# Patient Record
Sex: Male | Born: 1970 | Race: Black or African American | Hispanic: No | Marital: Married | Smoking: Never smoker
Health system: Southern US, Community
[De-identification: ages and names within clinical notes are randomized; demographics above are authoritative.]

## PROBLEM LIST (undated history)

## (undated) DIAGNOSIS — I10 Essential (primary) hypertension: Secondary | ICD-10-CM

## (undated) HISTORY — PX: SHOULDER ARTHROSCOPY: SHX128

## (undated) HISTORY — PX: KNEE SURGERY: SHX244

---

## 2019-10-13 ENCOUNTER — Emergency Department (HOSPITAL_COMMUNITY)
Admission: EM | Admit: 2019-10-13 | Discharge: 2019-10-13 | Disposition: A | Discharge status: Home / Self Care | Payer: Self-pay | Attending: Emergency Medicine | Admitting: Emergency Medicine

## 2019-10-13 ENCOUNTER — Encounter (HOSPITAL_COMMUNITY): Payer: Self-pay | Admitting: Emergency Medicine

## 2019-10-13 ENCOUNTER — Other Ambulatory Visit: Payer: Self-pay

## 2019-10-13 ENCOUNTER — Emergency Department (HOSPITAL_COMMUNITY): Payer: Self-pay

## 2019-10-13 DIAGNOSIS — T502X5A Adverse effect of carbonic-anhydrase inhibitors, benzothiadiazides and other diuretics, initial encounter: Secondary | ICD-10-CM | POA: Insufficient documentation

## 2019-10-13 DIAGNOSIS — I10 Essential (primary) hypertension: Secondary | ICD-10-CM | POA: Insufficient documentation

## 2019-10-13 DIAGNOSIS — R0789 Other chest pain: Secondary | ICD-10-CM | POA: Insufficient documentation

## 2019-10-13 DIAGNOSIS — R079 Chest pain, unspecified: Secondary | ICD-10-CM

## 2019-10-13 DIAGNOSIS — E876 Hypokalemia: Secondary | ICD-10-CM | POA: Insufficient documentation

## 2019-10-13 HISTORY — DX: Essential (primary) hypertension: I10

## 2019-10-13 LAB — CBC
HCT: 42.2 % (ref 39.0–52.0)
Hemoglobin: 14 g/dL (ref 13.0–17.0)
MCH: 27.1 pg (ref 26.0–34.0)
MCHC: 33.2 g/dL (ref 30.0–36.0)
MCV: 81.6 fL (ref 80.0–100.0)
Platelets: 262 10*3/uL (ref 150–400)
RBC: 5.17 MIL/uL (ref 4.22–5.81)
RDW: 12.4 % (ref 11.5–15.5)
WBC: 7.3 10*3/uL (ref 4.0–10.5)
nRBC: 0 % (ref 0.0–0.2)

## 2019-10-13 LAB — BASIC METABOLIC PANEL
Anion gap: 10 (ref 5–15)
BUN: 15 mg/dL (ref 6–20)
CO2: 26 mmol/L (ref 22–32)
Calcium: 9.4 mg/dL (ref 8.9–10.3)
Chloride: 106 mmol/L (ref 98–111)
Creatinine, Ser: 0.97 mg/dL (ref 0.61–1.24)
GFR calc Af Amer: >60 mL/min (ref >60)
GFR calc non Af Amer: >60 mL/min (ref >60)
Glucose, Bld: 102 mg/dL — ABNORMAL HIGH (ref 70–99)
Potassium: 3.2 mmol/L — ABNORMAL LOW (ref 3.5–5.1)
Sodium: 142 mmol/L (ref 135–145)

## 2019-10-13 LAB — TROPONIN I (HIGH SENSITIVITY)
Troponin I (High Sensitivity): 5 ng/L (ref <18)
Troponin I (High Sensitivity): 6 ng/L (ref <18)

## 2019-10-13 MED ORDER — SODIUM CHLORIDE 0.9% FLUSH: 3.0000 mL | Freq: Once | INTRAVENOUS | Status: DC

## 2019-10-13 MED ORDER — POTASSIUM CHLORIDE CRYS ER 20 MEQ PO TBCR: 20.0000 meq | EXTENDED_RELEASE_TABLET | Freq: Every day | ORAL | 0 refills | Status: DC

## 2019-10-13 MED ORDER — POTASSIUM CHLORIDE CRYS ER 20 MEQ PO TBCR
40.0000 meq | EXTENDED_RELEASE_TABLET | Freq: Once | ORAL | Status: AC
  Administered 2019-10-13: 40 meq via ORAL
  Filled 2019-10-13: qty 2

## 2019-10-13 NOTE — Discharge Instructions (Signed)
Continue with your current medications.  Follow up with the Cardiologist.  Return if you are having any problems.

## 2019-10-13 NOTE — ED Triage Notes (Signed)
Patient reports central chest pain onset this afternoon , denies SOB , no emesis or diaphoresis .

## 2019-10-13 NOTE — ED Provider Notes (Signed)
[facility] EMERGENCY DEPARTMENT Provider Note   CSN: [id number] Arrival date & time: 10/13/19  0102    History   Chief Complaint Chief Complaint  Patient presents with   Chest Pain    HPI Danny Peck is a 48 y.o. male.   The history is provided by the patient.  He has history of hypertension and hyperlipidemia and comes in with 3 episodes of chest pain over the last 2 weeks.  Pain is sharp and radiates to the left side of his neck and his left arm.  Nothing seems to make it better, nothing seems to make it worse.  It does seem to be coming on at times of relatively increased stress.  1 episode occurred 2 weeks ago, second episode 3 days ago and the third episode tonight.  Tonight, he also had an episode that it felt like his heart was fluttering.  There is no associated dyspnea, nausea, diaphoresis, but he does relate one episode of night sweats in the last 2 weeks.  He has been evaluated for chest pains in the past and had a cardiac catheterization 2 years ago showing no blockages greater than 10%.  He is a non-smoker and denies family history of premature coronary atherosclerosis.  He does have significant home stressors.  Past Medical History:  Diagnosis Date   Hypertension     There are no active problems to display for this patient.   ** The histories are not reviewed yet. Please review them in the "History" navigator section and refresh this SmartLink.      Home Medications    Prior to Admission medications   Not on File    Family History No family history on file.  Social History Social History   Tobacco Use   Smoking status: Never Smoker   Smokeless tobacco: Never Used  Substance Use Topics   Alcohol use: Never    Frequency: Never   Drug use: Never     Allergies   Patient has no known allergies.   Review of Systems Review of Systems  All other systems reviewed and are negative.    Physical Exam Updated Vital  Signs BP (!) 132/91 (BP Location: Right Arm)    Pulse 76    Temp 98 F (36.7 C) (Oral)    Resp 18    SpO2 96%   Physical Exam Vitals signs and nursing note reviewed.    48 year old male, resting comfortably and in no acute distress. Vital signs are significant for mildly elevated blood pressure. Oxygen saturation is 96%, which is normal. Head is normocephalic and atraumatic. PERRLA, EOMI. Oropharynx is clear. Neck is nontender and supple without adenopathy or JVD. Back is nontender and there is no CVA tenderness. Lungs are clear without rales, wheezes, or rhonchi. Chest is nontender. Heart has regular rate and rhythm without murmur. Abdomen is soft, flat, nontender without masses or hepatosplenomegaly and peristalsis is normoactive. Extremities have no cyanosis or edema, full range of motion is present. Skin is warm and dry without rash. Neurologic: Mental status is normal, cranial nerves are intact, there are no motor or sensory deficits.  ED Treatments / Results  Labs (all labs ordered are listed, but only abnormal results are displayed) Labs Reviewed  BASIC METABOLIC PANEL - Abnormal; Notable for the following components:      Result Value   Potassium 3.2 (*)    Glucose, Bld 102 (*)    All other components within normal limits  CBC  TROPONIN I (HIGH SENSITIVITY)  TROPONIN I (HIGH SENSITIVITY)    EKG EKG Interpretation  Date/Time:  Sunday October 13 2019 01:16:25 EDT Ventricular Rate:  76 PR Interval:  212 QRS Duration: 98 QT Interval:  394 QTC Calculation: 443 R Axis:   -62 Text Interpretation: Sinus rhythm with 1st degree A-V block Left axis deviation Abnormal ECG No old tracing to compare Confirmed by Kelsee Preslar ([id number]) on 10/13/2019 1:40:35 AM   Radiology Dg Chest 2 View  Result Date: 10/13/2019 CLINICAL DATA:  47 year old male with chest pain. EXAM: CHEST - 2 VIEW COMPARISON:  None. FINDINGS: The heart size and mediastinal contours are within normal limits.  Both lungs are clear. The visualized skeletal structures are unremarkable. IMPRESSION: No active cardiopulmonary disease. Electronically Signed   By: Arash  Radparvar M.D.   On: 10/13/2019 02:25    Procedures Procedures   Medications Ordered in ED Medications  sodium chloride flush (NS) 0.9 % injection 3 mL (has no administration in time range)  potassium chloride SA (KLOR-CON) CR tablet 40 mEq (40 mEq Oral Given 10/13/19 0635)     Initial Impression / Assessment and Plan / ED Course  I have reviewed the triage vital signs and the nursing notes.  Pertinent labs & imaging results that were available during my care of the patient were reviewed by me and considered in my medical decision making (see chart for details).  Chest pain which seems very atypical for ACS.  ECG is unremarkable and chest x-ray is normal.  Troponin is normal x2.  Labs are significant only for hypokalemia which is presumably secondary to diuretic use.  Chest x-ray is unremarkable.  Old records are reviewed, and he did have an evaluation at [facility] for chest pain with a very similar description of symptoms.  At this point, it seems likely that there is a strong emotional overlay.  He does have risk factors for coronary disease, but heart score is 2 which puts him at low risk for major adverse cardiac events in the next 6 weeks.  Patient is reassured that there is no evidence of an acute cardiac event today, and is referred to cardiology for outpatient work-up.  Final Clinical Impressions(s) / ED Diagnoses   Final diagnoses:  Nonspecific chest pain  Diuretic-induced hypokalemia    ED Discharge Orders         Ordered    Ambulatory referral to Cardiology     10/13/19 0616    potassium chloride SA (KLOR-CON) 20 MEQ tablet  Daily     11 /01/20 8984           Delora Fuel, MD 21/03/12 [phone]

## 2019-10-13 NOTE — ED Notes (Signed)
Patient verbalizes understanding of discharge instructions. Opportunity for questioning and answers were provided. Armband removed by staff, pt discharged from ED.  

## 2019-10-29 NOTE — Progress Notes (Deleted)
Cardiology Office Note:   Date:  10/29/2019  NAME:  Danny Peck    MRN: [id number] DOB:  06-Mar-1971   PCP:  [facility]  Cardiologist:  No primary care provider on file.  Electrophysiologist:  None   Referring MD: Delora Fuel, MD   No chief complaint on file. ***  History of Present Illness:   [name] is a 48 y.o. male with a hx of hypertension who is being seen today for the evaluation of chest pain at the request of Delora Fuel, MD.  He was seen in the emergency room on 11/1 for atypical chest pain.  EKG chest x-ray unremarkable.  Cardiac enzymes negative x2.  He was discharged home with plans to follow-up here.  Past Medical History: Past Medical History:  Diagnosis Date  . Hypertension     Past Surgical History: *** The histories are not reviewed yet. Please review them in the "History" navigator section and refresh this [place].  Current Medications: No outpatient medications have been marked as taking for the 10/30/19 encounter (Appointment) with O'Neal, Cassie Freer, MD.     Allergies:    Primaquine   Social History: Social History   Socioeconomic History  . Marital status: Married    Spouse name: Not on file  . Number of children: Not on file  . Years of education: Not on file  . Highest education level: Not on file  Occupational History  . Not on file  Social Needs  . Financial resource strain: Not on file  . Food insecurity    Worry: Not on file    Inability: Not on file  . Transportation needs    Medical: Not on file    Non-medical: Not on file  Tobacco Use  . Smoking status: Never Smoker  . Smokeless tobacco: Never Used  Substance and Sexual Activity  . Alcohol use: Never    Frequency: Never  . Drug use: Never  . Sexual activity: Not on file  Lifestyle  . Physical activity    Days per week: Not on file    Minutes per session: Not on file  . Stress: Not on file  Relationships  . Social Herbalist on  phone: Not on file    Gets together: Not on file    Attends religious service: Not on file    Active member of club or organization: Not on file    Attends meetings of clubs or organizations: Not on file    Relationship status: Not on file  Other Topics Concern  . Not on file  Social History Narrative  . Not on file     Family History: The patient's ***family history is not on file.  ROS:   All other ROS reviewed and negative. Pertinent positives noted in the HPI.     EKGs/Labs/Other Studies Reviewed:   The following studies were personally reviewed by me today:  EKG:  EKG is *** ordered today.  The ekg ordered today demonstrates ***, and was personally reviewed by me.   Recent Labs: 10/13/2019: BUN 15; Creatinine, Ser 0.97; Hemoglobin 14.0; Platelets 262; Potassium 3.2; Sodium 142   Recent Lipid Panel No results found for: CHOL, TRIG, HDL, CHOLHDL, VLDL, LDLCALC, LDLDIRECT  Physical Exam:   VS:  There were no vitals taken for this visit.   Wt Readings from Last 3 Encounters:  No data found for Wt    General: Well nourished, well developed, in no acute distress Heart: Atraumatic, normal  size  Eyes: PEERLA, EOMI  Neck: Supple, no JVD Endocrine: No thryomegaly Cardiac: Normal S1, S2; RRR; no murmurs, rubs, or gallops Lungs: Clear to auscultation bilaterally, no wheezing, rhonchi or rales  Abd: Soft, nontender, no hepatomegaly  Ext: No edema, pulses 2+ Musculoskeletal: No deformities, BUE and BLE strength normal and equal Skin: Warm and dry, no rashes   Neuro: Alert and oriented to person, place, time, and situation, CNII-XII grossly intact, no focal deficits  Psych: Normal mood and affect   ASSESSMENT:   Danny Peck is a 48 y.o. male who presents for the following: No diagnosis found.  PLAN:   There are no diagnoses linked to this encounter.  Disposition: No follow-ups on file.  Medication Adjustments/Labs and Tests Ordered: Current medicines are reviewed at  length with the patient today.  Concerns regarding medicines are outlined above.  No orders of the defined types were placed in this encounter.  No orders of the defined types were placed in this encounter.   There are no Patient Instructions on file for this visit.   Signed, Lenna Gilford. Flora Lipps, MD [facility]  [address] [place], [place] [address] [phone]  10/29/2019 6:24 PM

## 2019-10-30 ENCOUNTER — Ambulatory Visit: Payer: Self-pay | Admitting: Cardiovascular Disease

## 2020-04-29 DIAGNOSIS — Z20822 Contact with and (suspected) exposure to covid-19: Secondary | ICD-10-CM | POA: Diagnosis not present

## 2020-04-29 DIAGNOSIS — J1089 Influenza due to other identified influenza virus with other manifestations: Secondary | ICD-10-CM | POA: Diagnosis not present

## 2020-04-29 DIAGNOSIS — R519 Headache, unspecified: Secondary | ICD-10-CM | POA: Diagnosis not present

## 2020-04-29 DIAGNOSIS — R05 Cough: Secondary | ICD-10-CM | POA: Diagnosis not present

## 2020-04-29 DIAGNOSIS — R509 Fever, unspecified: Secondary | ICD-10-CM | POA: Diagnosis not present

## 2020-04-29 DIAGNOSIS — R430 Anosmia: Secondary | ICD-10-CM | POA: Diagnosis not present

## 2020-05-19 IMAGING — CR DG CHEST 2V
2 series · 2 of 2 positions shown · non-contrast
Comparison: None.

CLINICAL DATA: 47-year-old male with chest pain.

EXAM:
CHEST - 2 VIEW

[chest pa]
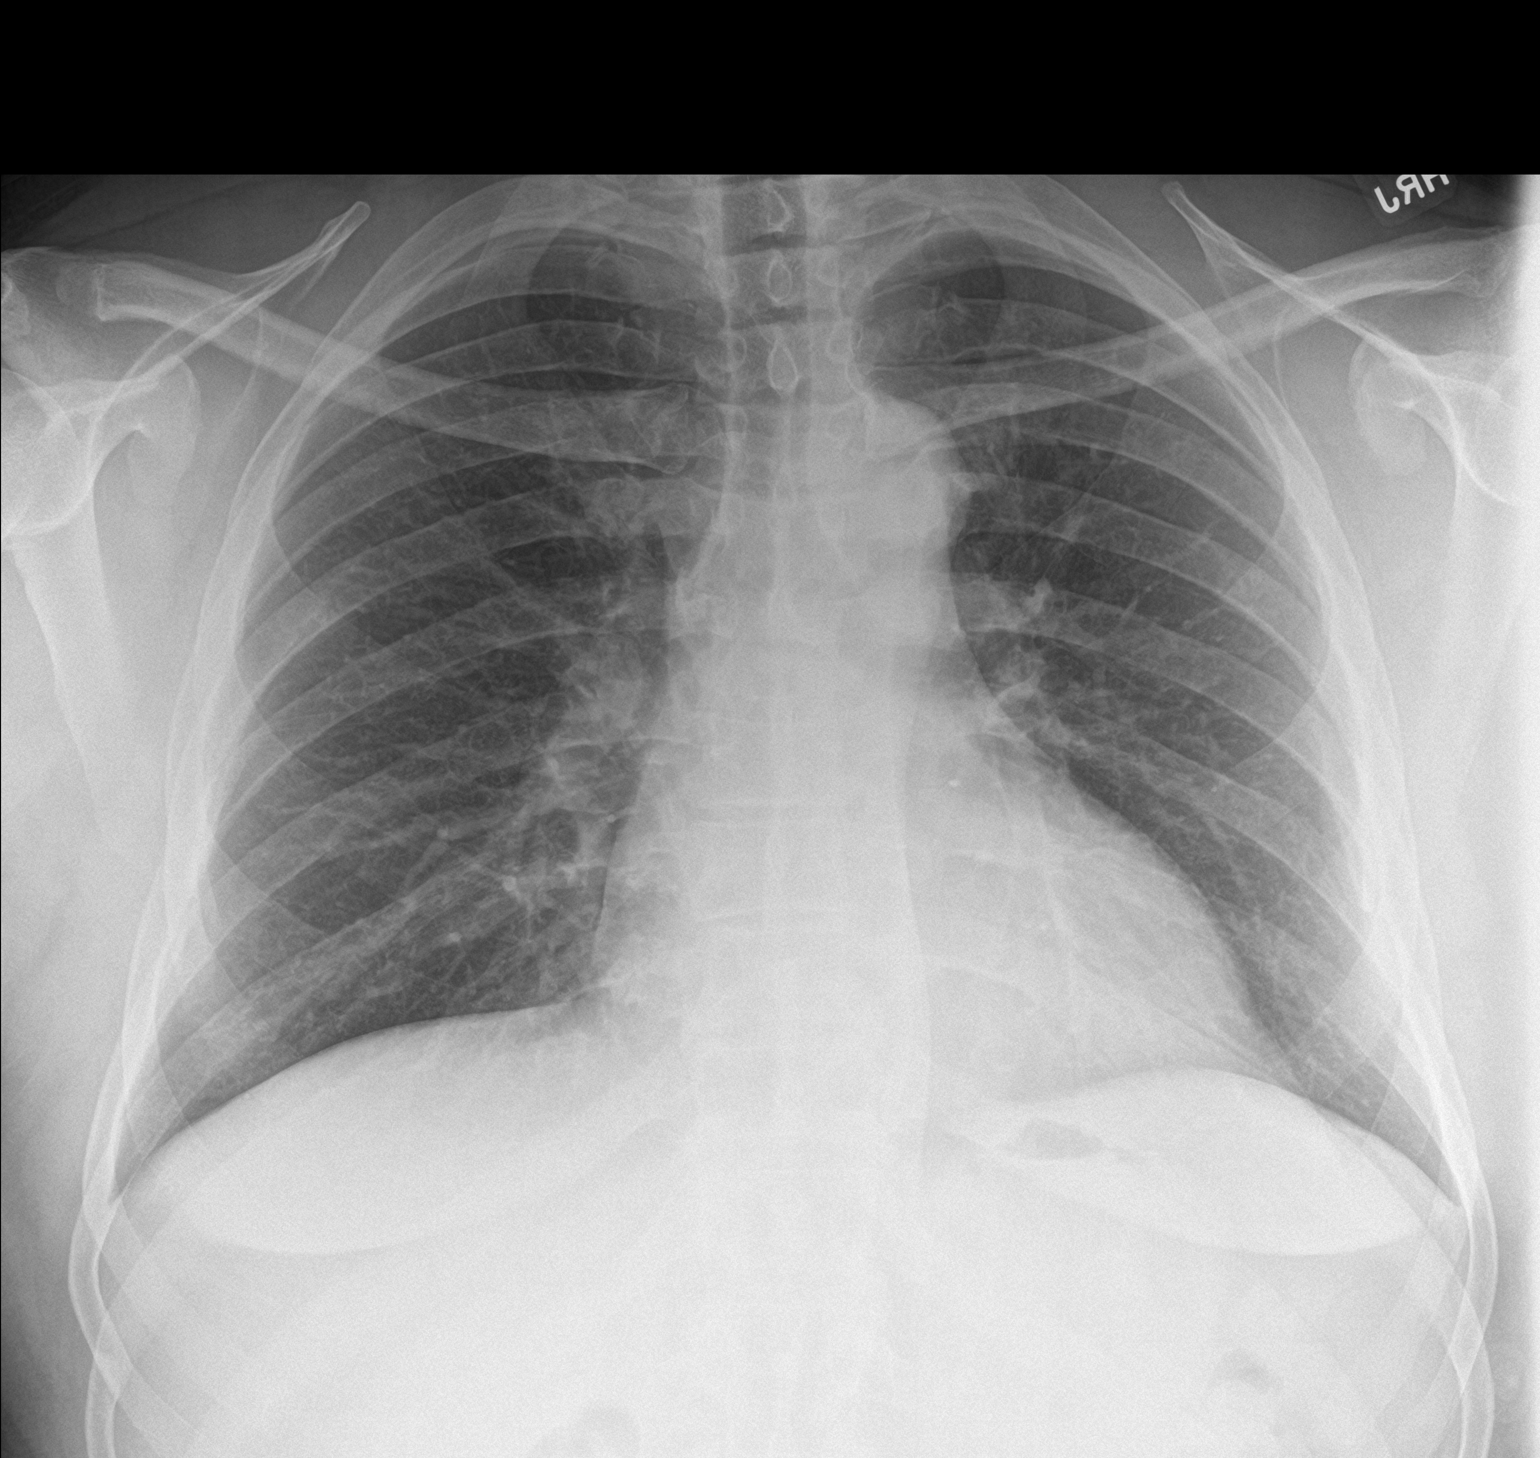

[chest lat]
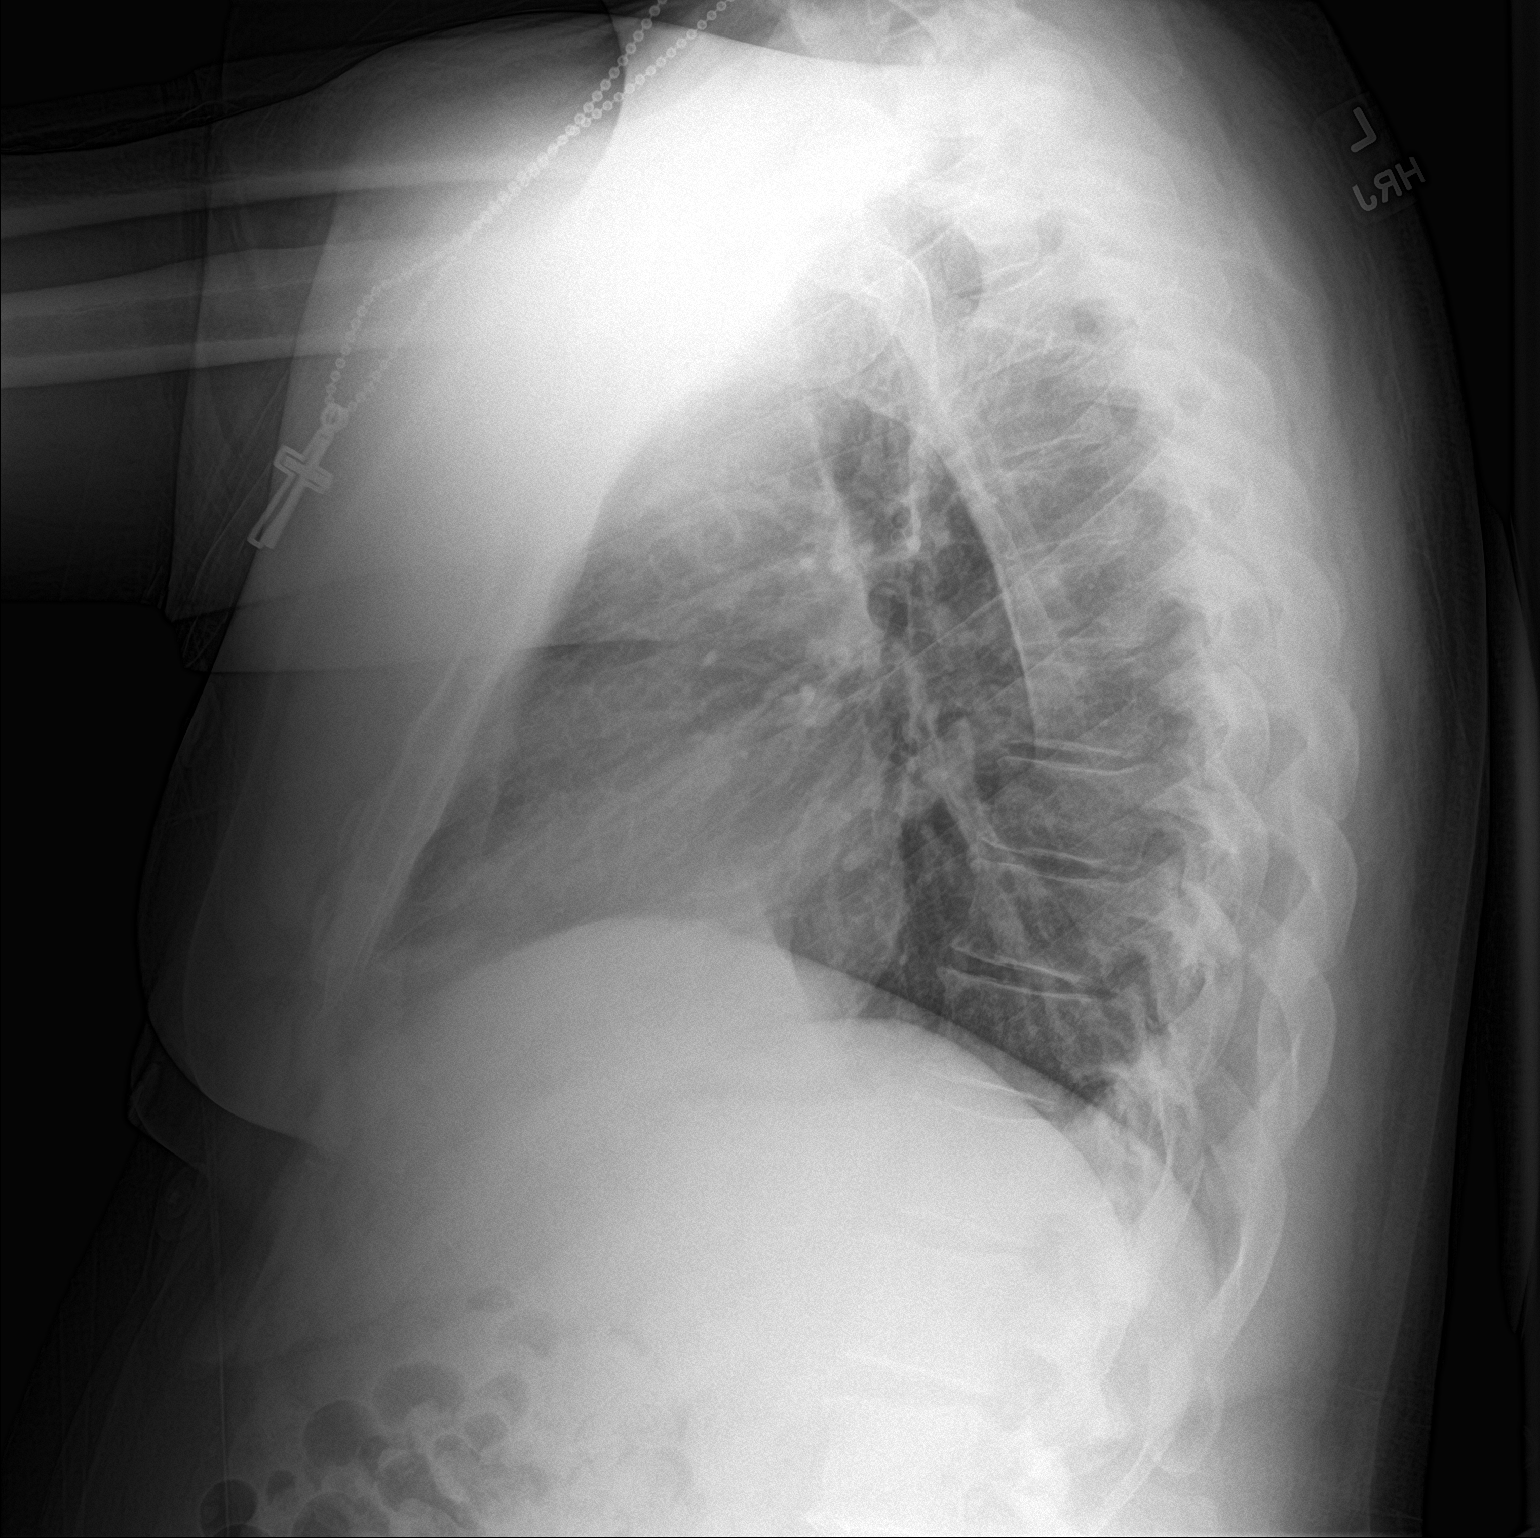

[2 of 2 positions shown; findings below may reference images not displayed]

FINDINGS: The heart size and mediastinal contours are within normal limits.
Both lungs are clear. The visualized skeletal structures are
unremarkable.
IMPRESSION: No active cardiopulmonary disease.

## 2022-03-31 ENCOUNTER — Ambulatory Visit (INDEPENDENT_AMBULATORY_CARE_PROVIDER_SITE_OTHER): Payer: No Typology Code available for payment source | Admitting: Pulmonary Disease

## 2022-03-31 ENCOUNTER — Ambulatory Visit (INDEPENDENT_AMBULATORY_CARE_PROVIDER_SITE_OTHER): Payer: No Typology Code available for payment source

## 2022-03-31 ENCOUNTER — Encounter: Payer: Self-pay | Admitting: Pulmonary Disease

## 2022-03-31 VITALS — BP 132/70 | HR 78 | Temp 97.9°F | Ht 77.0 in | Wt 317.8 lb

## 2022-03-31 DIAGNOSIS — R0609 Other forms of dyspnea: Secondary | ICD-10-CM

## 2022-03-31 LAB — D-DIMER, QUANTITATIVE: D-Dimer, Quant: <0.19 mcg/mL FEU (ref <0.50)

## 2022-03-31 MED ORDER — BUDESONIDE-FORMOTEROL FUMARATE 160-4.5 MCG/ACT IN AERO: 2.0000 | INHALATION_SPRAY | Freq: Two times a day (BID) | RESPIRATORY_TRACT | 12 refills | Status: DC

## 2022-03-31 NOTE — Patient Instructions (Signed)
Nice to meet you ? ?We will get a Chest xray and labs today ? ?We will schedule PFT or pulmonary function tests in the next few weeks to get more information ? ?We will start a new inhaler to help with symptoms as well as help with getting most accurate data for PFT ? ?Use Symbicort 2 puffs twice a day every day - let me know if cost is too much and we will find alternative ? ?Return to clinic in 6 weeks or sooner as needed ?

## 2022-03-31 NOTE — Progress Notes (Signed)
D dimer is normal, no need to worry about blood clots.

## 2022-04-04 NOTE — Progress Notes (Signed)
? ?'@Patient'  ID: Danny Peck, male    DOB: 1971/07/08, 51 y.o.   MRN: [id number] ? ?Chief Complaint  ?Patient presents with  ? Consult  ?  Consult for dyspnea. Pt states this has been going on for over a year now. He thinks his weight is what triggered it.   ? ? ?Referring provider: ?Andris Flurry, PA-C ? ?HPI:  ? ?51 y.o. whom are seen in consultation for longstanding dyspnea on exertion.  Multiple notes via [place] reviewed. ? ?Patient ports longstanding history of dyspnea on exertion.  Multiple work-ups in the past with cardiology.  Also met with pulmonologist in the past.  Initially started with episode of high blood pressure.  Intermittent episodes of high blood pressure, dyspnea, chest tightness.  Has been worked up for pheochromocytoma negative in the past.  Now on blood pressure medicine.  Sensation of dyspnea and chest tightness has worsened over time.  Over the last year or 2.  This is associated with significant weight gain.  She discomfort described as tightness, heaviness.  He has had multiple stress test left heart cath, most recently 2018 with only 10%, minimal CAD. ? ?He does have seasonal allergies.  Occasional bronchitis.  No seasonal changes that make things better or worse.  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  This does seem to be around the same time symptoms significantly worsened or worse in the most over the last year.  Has not used any maintenance inhalers. ? ?Reviewed most recent chest x-ray 10/2019 that on my review interpretation reveals clear lungs bilaterally. ? ?PMH: Hypertension, ?Family history:History reviewed. No pertinent family history. ?Surgical history: Knee surgery, shoulder arthroscopy ?Social history: Never smoker, lives in [place], works in radiology ? ? ?Questionaires / Pulmonary Flowsheets:  ? ?ACT:  ?   ? View : No data to display.  ?  ?  ?  ? ? ?MMRC: ?   ? View : No data to display.  ?  ?  ?   ? ? ?Epworth:  ?   ? View : No data to display.  ?  ?  ?  ? ? ?Tests:  ? ?FENO:  ?No results found for: NITRICOXIDE ? ?PFT: ?   ? View : No data to display.  ?  ?  ?  ? ? ?WALK:  ?   ? View : No data to display.  ?  ?  ?  ? ? ?Imaging: ?Personally reviewed and as per EMR discussion this note ?DG Chest 2 View ? ?Result Date: 04/02/2022 ?CLINICAL DATA:  DOE EXAM: CHEST - 2 VIEW COMPARISON:  Chest x-ray October 13, 2019. FINDINGS: No consolidation. No visible pleural effusions or pneumothorax. Cardiomediastinal silhouette is within normal limits. Similar mildly tortuous aorta. No displaced fracture. IMPRESSION: No evidence of acute cardiopulmonary disease. Electronically Signed   By: Margaretha Sheffield M.D.   On: 04/02/2022 11:35   ? ?Lab Results: ?Personally reviewed ?CBC ?   ?Component Value Date/Time  ? WBC 7.3 10/13/2019 0127  ? RBC 5.17 10/13/2019 0127  ? HGB 14.0 10/13/2019 0127  ? HCT 42.2 10/13/2019 0127  ? PLT 262 10/13/2019 0127  ? MCV 81.6 10/13/2019 0127  ? MCH 27.1 10/13/2019 0127  ? MCHC 33.2 10/13/2019 0127  ? RDW 12.4 10/13/2019 0127  ? ? ?BMET ?   ?Component Value Date/Time  ? NA 142 10/13/2019 0127  ? K 3.2 (L) 10/13/2019 0127  ? CL 106 10/13/2019 0127  ?  CO2 26 10/13/2019 0127  ? GLUCOSE 102 (H) 10/13/2019 0127  ? BUN 15 10/13/2019 0127  ? CREATININE 0.97 10/13/2019 0127  ? CALCIUM 9.4 10/13/2019 0127  ? GFRNONAA >60 10/13/2019 0127  ? GFRAA >60 10/13/2019 0127  ? ? ?BNP ?No results found for: BNP ? ?ProBNP ?No results found for: PROBNP ? ?Specialty Problems   ?None ? ? ?Allergies  ?Allergen Reactions  ? Primaquine Other (See Comments)  ?  "I'm not sure."  ? ? ? ?There is no immunization history on file for this patient. ? ?Past Medical History:  ?Diagnosis Date  ? Hypertension   ? ? ?Tobacco History: ?Social History  ? ?Tobacco Use  ?Smoking Status Never  ? Passive exposure: Never  ?Smokeless Tobacco Never  ? ?Counseling given: Not Answered ? ? ?Continue to not smoke ? ?Outpatient Encounter  Medications as of 03/31/2022  ?Medication Sig  ? acyclovir (ZOVIRAX) 800 MG tablet Take 0.5 tablets by mouth 2 (two) times daily.  ? amLODipine (NORVASC) 10 MG tablet Take 10 mg by mouth daily.  ? aspirin EC 81 MG tablet Take 81 mg by mouth daily.  ? atorvastatin (LIPITOR) 10 MG tablet Take 10 mg by mouth daily.  ? budesonide-formoterol (SYMBICORT) 160-4.5 MCG/ACT inhaler Inhale 2 puffs into the lungs in the morning and at bedtime.  ? hydrochlorothiazide (HYDRODIURIL) 25 MG tablet Take 25 mg by mouth daily.  ? lisinopril (ZESTRIL) 5 MG tablet Take 5 mg by mouth daily.  ? metoprolol tartrate (LOPRESSOR) 50 MG tablet Take 50 mg by mouth 2 (two) times daily.  ? [DISCONTINUED] potassium chloride SA (KLOR-CON) 20 MEQ tablet Take 1 tablet (20 mEq total) by mouth daily.  ? ?No facility-administered encounter medications on file as of 03/31/2022.  ? ? ? ?Review of Systems ? ?Review of Systems  ?Chest pain occasional with exertion but also happens at rest.  No orthopnea or PND.  No lower extremity swelling. ?Physical Exam ? ?BP 132/70 (BP Location: Left Arm, Patient Position: Sitting, Cuff Size: Normal)   Pulse 78   Temp 97.9 ?F (36.6 ?C) (Oral)   Ht '6\' 5"'  (1.956 m)   Wt (!) 317 lb 12.8 oz (144.2 kg)   SpO2 97%   BMI 37.69 kg/m?  ? ?Wt Readings from Last 5 Encounters:  ?03/31/22 (!) 317 lb 12.8 oz (144.2 kg)  ? ? ?BMI Readings from Last 5 Encounters:  ?03/31/22 37.69 kg/m?  ? ? ? ?Physical Exam ?General: Well-appearing, no acute distress ?Eyes: EOMI, no icterus ?Neck: Supple, no JVP ?Pulmonary: Clear, no wheeze ?Cardiovascular: Regular rate and rhythm, no murmur ?Abdomen: Nondistended, bowel sounds present ?MSK: No synovitis, joint effusion ?Neuro: Normal gait, no weakness ?Psych: Normal mood, full affect ? ?Assessment & Plan:  ? ?Dyspnea on exertion: Suspect somewhat related to weight gain over the last several months.  Given onset with change in environment, some chest tightness with normal cardiac work-up in the past,  do query underlying asthma or reactive airways disease now exacerbated in the setting of new environment.  Will obtain chest x-ray today, PFTs in the near future for further evaluation.  Pulmonary embolus considered in the setting of chest discomfort, will obtain D-dimer. ? ?Chest tightness: Query if related to asthma.  Start Symbicort 2 puffs twice daily.  Cardiac work-up negative and reassuring. ? ? ?Return in about 6 weeks (around 05/12/2022). ? ? ?Lanier Clam, MD ?04/04/2022 ? ? ?This appointment required 65 minutes of patient care (this includes precharting, chart review, review of  results, face-to-face care, etc.). ? ?

## 2022-05-25 ENCOUNTER — Ambulatory Visit: Payer: No Typology Code available for payment source | Admitting: Pulmonary Disease

## 2022-09-22 ENCOUNTER — Encounter: Payer: Self-pay | Admitting: Pulmonary Disease

## 2022-09-22 ENCOUNTER — Ambulatory Visit (INDEPENDENT_AMBULATORY_CARE_PROVIDER_SITE_OTHER): Payer: No Typology Code available for payment source | Admitting: Pulmonary Disease

## 2022-09-22 VITALS — BP 132/70 | HR 74 | Ht 77.0 in | Wt 310.0 lb

## 2022-09-22 DIAGNOSIS — R053 Chronic cough: Secondary | ICD-10-CM

## 2022-09-22 DIAGNOSIS — R0609 Other forms of dyspnea: Secondary | ICD-10-CM | POA: Diagnosis not present

## 2022-09-22 LAB — PULMONARY FUNCTION TEST
DL/VA % pred: 126 %
DL/VA: 5.47 ml/min/mmHg/L
DLCO cor % pred: 81 %
DLCO cor: 30.31 ml/min/mmHg
DLCO unc % pred: 80 %
DLCO unc: 29.88 ml/min/mmHg
FEF 25-75 Post: 4.26 L/sec
FEF 25-75 Pre: 4.13 L/sec
FEF2575-%Change-Post: 3 %
FEF2575-%Pred-Post: 98 %
FEF2575-%Pred-Pre: 95 %
FEV1-%Change-Post: 0 %
FEV1-%Pred-Post: 63 %
FEV1-%Pred-Pre: 63 %
FEV1-Post: 3.22 L
FEV1-Pre: 3.19 L
FEV1FVC-%Change-Post: 1 %
FEV1FVC-%Pred-Pre: 109 %
FEV6-%Change-Post: 0 %
FEV6-%Pred-Post: 58 %
FEV6-%Pred-Pre: 59 %
FEV6-Post: 3.71 L
FEV6-Pre: 3.74 L
FEV6FVC-%Pred-Post: 103 %
FEV6FVC-%Pred-Pre: 103 %
FVC-%Change-Post: 0 %
FVC-%Pred-Post: 57 %
FVC-%Pred-Pre: 57 %
FVC-Post: 3.71 L
FVC-Pre: 3.74 L
Post FEV1/FVC ratio: 87 %
Post FEV6/FVC ratio: 100 %
Pre FEV1/FVC ratio: 85 %
Pre FEV6/FVC Ratio: 100 %
RV % pred: 69 %
RV: 1.71 L
TLC % pred: 69 %
TLC: 5.92 L

## 2022-09-22 MED ORDER — BREZTRI AEROSPHERE 160-9-4.8 MCG/ACT IN AERO: 2.0000 | INHALATION_SPRAY | Freq: Two times a day (BID) | RESPIRATORY_TRACT | 0 refills | Status: DC

## 2022-09-22 NOTE — Progress Notes (Signed)
Full PFT performed today. °

## 2022-09-22 NOTE — Patient Instructions (Signed)
  We will try a stronger inhaler Use breztri 2 puffs twice a day  Rinse mouth after every use  We will get blood work today  I will track down the images and review the CT  Return to clinic in 6 weeks or sooner as needed  Thank you for enrolling in [facility]. Please follow the instructions below to securely access your online medical record. MyChart allows you to send messages to your doctor, view your test results, renew your prescriptions, schedule appointments, and more.  How Do I Sign Up? In your Internet browser, go to [email or web address]. Click on the New  User? link in the Sign In box.  Enter your MyChart Access Code exactly as it appears below. You will not need to use this code after you have completed the sign-up process. If you do not sign up before the expiration date, you must request a new code. MyChart Access Code: [email or web address] Expires: 11/06/2022  4:22 PM  Enter the last four digits of your Social Security Number (xxxx) and Date of Birth (mm/dd/yyyy) as indicated and click Next. You will be taken to the next sign-up page. Create a MyChart ID. This will be your MyChart login ID and cannot be changed, so think of one that is secure and easy to remember. Create a Scientist, research (physical sciences). You can change your password at any time. Enter your Password Reset Question and Answer and click Next. This can be used at a later time if you forget your password.  Select your communication preference, and if applicable enter your e-mail address. You will receive e-mail notification when new information is available in MyChart by choosing to receive e-mail notifications and filling in your e-mail. Click Sign In. You can now view your medical record.   Additional Information If you have questions, you can email [email or web address] or call [phone] to talk to our [facility] staff. Remember, MyChart is NOT to be used for urgent needs. For medical emergencies, dial 911.

## 2022-09-22 NOTE — Patient Instructions (Signed)
Full PFT performed today. °

## 2022-09-23 ENCOUNTER — Encounter: Payer: Self-pay | Admitting: Pulmonary Disease

## 2022-09-23 LAB — CBC WITH DIFFERENTIAL/PLATELET
Basophils Absolute: 0.1 10*3/uL (ref 0.0–0.1)
Basophils Relative: 0.7 % (ref 0.0–3.0)
Eosinophils Absolute: 0 10*3/uL (ref 0.0–0.7)
Eosinophils Relative: 0.1 % (ref 0.0–5.0)
HCT: 41 % (ref 39.0–52.0)
Hemoglobin: 13.4 g/dL (ref 13.0–17.0)
Lymphocytes Relative: 11.9 % — ABNORMAL LOW (ref 12.0–46.0)
Lymphs Abs: 1.2 10*3/uL (ref 0.7–4.0)
MCHC: 32.7 g/dL (ref 30.0–36.0)
MCV: 81.5 fl (ref 78.0–100.0)
Monocytes Absolute: 0.6 10*3/uL (ref 0.1–1.0)
Monocytes Relative: 5.6 % (ref 3.0–12.0)
Neutro Abs: 8 10*3/uL — ABNORMAL HIGH (ref 1.4–7.7)
Neutrophils Relative %: 81.7 % — ABNORMAL HIGH (ref 43.0–77.0)
Platelets: 227 10*3/uL (ref 150.0–400.0)
RBC: 5.03 Mil/uL (ref 4.22–5.81)
RDW: 13.5 % (ref 11.5–15.5)
WBC: 9.9 10*3/uL (ref 4.0–10.5)

## 2022-09-23 LAB — IGE: IgE (Immunoglobulin E), Serum: 46 kU/L (ref <=114)

## 2022-09-23 NOTE — Progress Notes (Signed)
_0  ID: Danny Peck, male    DOB: 05/03/1971, 51 y.o.   MRN: [id number]  Chief Complaint  Patient presents with   Follow-up    Follow up for [place]. Pt had full PFTs done today. Pt states that his breathing is the same. He states that all three of his kids and wife over the weekend tested positive from strep. He states he tested positive for lung infection. Steroids and ABT were given to him Monday. Pt states that he ran out of Symbicort.     [facility]: [facility]  HPI:   51 y.o. whom are seen in consultation for longstanding dyspnea on exertion.  3 most recent PCP office notes via [facility] reviewed.  Return to clinic overdue for follow-up.  Plan to see him in June or so after last visit.  6-week follow-up.  He returns 6 months later.  Sound like he was out of town at time of prior follow-up and then can be rescheduled only now several months later.  Unfortunately, cough seemed to worsen shortly after her last visit with me.  Given description of symptoms there is concern for asthma contributing to dyspnea on exertion and cough.  Again cough worsened.  Largely dry.  Present throughout the day.  Over the last few weeks it seemed to worsen in the setting of sore throat.  Some fever.  Multiple members in the household as well.  More productive cough with this illness.  States he went to see his local primary care provider.  He was diagnosed with strep throat although he is swab negative, multiple numbers of his household had swab positive.  Also diagnosed with pneumonia.  With ongoing cough a CT scan of the chest was obtained via the [place].  I cannot view the images at this time, but the report comments on right middle lobe and right lower lobe groundglass opacities.  This was in the setting of him feeling ill as above.  He was given antibiotics.  His cough has improved over the last 2 to 3 days.  Was gone for couple days but has recurred over the last 24 hours or so.   Although much improved from the couple weeks prior.  Discussed at length his pulmonary function test performed today.  These indicate mild to moderate restriction with severely low ERV and normal DLCO, all suggestive of extraparenchymal restriction related to habitus or musculoskeletal issues, or neuromuscular issues.  Does not really have any symptoms of MSK or neuromuscular disease.  Also possible that findings are falsely low in the setting of recent acute illness.  He does not think the Symbicort helped at all.  Has been off of it for some period of time but took it for few months it sounds like.  HPI at initial visit: Patient ports longstanding history of dyspnea on exertion.  Multiple work-ups in the past with cardiology.  Also met with pulmonologist in the past.  Initially started with episode of high blood pressure.  Intermittent episodes of high blood pressure, dyspnea, chest tightness.  Has been worked up for pheochromocytoma negative in the past.  Now on blood pressure medicine.  Sensation of dyspnea and chest tightness has worsened over time.  Over the last year or 2.  This is associated with significant weight gain.  She discomfort described as tightness, heaviness.  He has had multiple stress test left heart cath, most recently 2018 with only 10%, minimal CAD.  He does have seasonal allergies.  Occasional  bronchitis.  No seasonal changes that make things better or worse.  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  This does seem to be around the same time symptoms significantly worsened or worse in the most over the last year.  Has not used any maintenance inhalers.  Reviewed most recent chest x-ray 10/2019 that on my review interpretation reveals clear lungs bilaterally.  PMH: Hypertension, Family history:History reviewed. No pertinent family history. Surgical history: Knee surgery, shoulder arthroscopy Social history: Never smoker, lives in  [place], works in [employer] / Pul[employer]:   ACT:      No data to display           MMRC:     No data to display           Epworth:      No data to display           Tests:   FENO:  No results found for: "NITRICOXIDE"  PFT:    Latest Ref Rng & Units 09/22/2022    3:01 PM  PFT Results  FVC-Pre L 3.74  P  FVC-Predicted Pre % 57  P  FVC-Post L 3.71  P  FVC-Predicted Post % 57  P  Pre FEV1/FVC % % 85  P  Post FEV1/FCV % % 87  P  FEV1-Pre L 3.19  P  FEV1-Predicted Pre % 63  P  FEV1-Post L 3.22  P  DLCO uncorrected ml/min/mmHg 29.88  P  DLCO UNC% % 80  P  DLCO corrected ml/min/mmHg 30.31  P  DLCO COR %Predicted % 81  P  DLVA Predicted % 126  P  TLC L 5.92  P  TLC % Predicted % 69  P  RV % Predicted % 69  P    P Preliminary result   Personally reviewed interpret as spirometry just of air trapping versus restriction.  Mild to moderate restriction present with severely low ERV.  Normal DLCO.  Suggestion of extraparenchymal restriction.  WALK:      No data to display           Imaging: Personally reviewed and as per EMR discussion this note No results found.  Lab Results: Personally reviewed CBC    Component Value Date/Time   WBC 7.3 10/13/2019 0127   RBC 5.17 10/13/2019 0127   HGB 14.0 10/13/2019 0127   HCT 42.2 10/13/2019 0127   PLT 262 10/13/2019 0127   MCV 81.6 10/13/2019 0127   MCH 27.1 10/13/2019 0127   MCHC 33.2 10/13/2019 0127   RDW 12.4 10/13/2019 0127    BMET    Component Value Date/Time   NA 142 10/13/2019 0127   K 3.2 (L) 10/13/2019 0127   CL 106 10/13/2019 0127   CO2 26 10/13/2019 0127   GLUCOSE 102 (H) 10/13/2019 0127   BUN 15 10/13/2019 0127   CREATININE 0.97 10/13/2019 0127   CALCIUM 9.4 10/13/2019 0127   GFRNONAA >60 10/13/2019 0127   GFRAA >60 10/13/2019 0127    BNP No results found for: "BNP"  ProBNP No results found for: "PROBNP"  Specialty Problems    None   Allergies  Allergen Reactions   Primaquine Other (See Comments)    "I'm not sure."     There is no immunization history on file for this patient.  Past Medical History:  Diagnosis Date   Hypertension     Tobacco History: Social History   Tobacco Use  Smoking  Status Never   Passive exposure: Never  Smokeless Tobacco Never   Counseling given: Not Answered   Continue to not smoke  Outpatient Encounter Medications as of 09/22/2022  Medication Sig   acyclovir (ZOVIRAX) 800 MG tablet Take 0.5 tablets by mouth 2 (two) times daily.   amLODipine (NORVASC) 10 MG tablet Take 10 mg by mouth daily.   amoxicillin-clavulanate (AUGMENTIN) 875-125 MG tablet Take 1 tablet by mouth 2 (two) times daily.   aspirin EC 81 MG tablet Take 81 mg by mouth daily.   atorvastatin (LIPITOR) 10 MG tablet Take 10 mg by mouth daily.   Budeson-Glycopyrrol-Formoterol (BREZTRI AEROSPHERE) 160-9-4.8 MCG/ACT AERO Inhale 2 puffs into the lungs in the morning and at bedtime.   budesonide-formoterol (SYMBICORT) 160-4.5 MCG/ACT inhaler Inhale 2 puffs into the lungs in the morning and at bedtime.   hydrochlorothiazide (HYDRODIURIL) 25 MG tablet Take 25 mg by mouth daily.   lisinopril (ZESTRIL) 5 MG tablet Take 5 mg by mouth daily.   metoprolol tartrate (LOPRESSOR) 50 MG tablet Take 50 mg by mouth 2 (two) times daily.   predniSONE (STERAPRED UNI-PAK 21 TAB) 10 MG (21) TBPK tablet Take by mouth as directed.   promethazine-dextromethorphan (PROMETHAZINE-DM) 6.25-15 MG/5ML syrup Take 5 mLs by mouth every 6 (six) hours as needed.   No facility-administered encounter medications on file as of 09/22/2022.     Review of Systems  Review of Systems  N/a Physical Exam  BP 132/70 (BP Location: Left Arm, Patient Position: Sitting, Cuff Size: Normal)   Pulse 74   Ht _0  (1.956 m)   Wt (!) 310 lb (140.6 kg)   SpO2 93%   BMI 36.76 kg/m   Wt Readings from Last 5 Encounters:  09/22/22 (!) 310 lb (140.6  kg)  03/31/22 (!) 317 lb 12.8 oz (144.2 kg)    BMI Readings from Last 5 Encounters:  09/22/22 36.76 kg/m  03/31/22 37.69 kg/m     Physical Exam General: Well-appearing, no acute distress Eyes: EOMI, no icterus Neck: Supple, no JVP Pulmonary: Clear, no wheeze Cardiovascular: Regular rate and rhythm, no murmur Abdomen: Nondistended, bowel sounds present MSK: No synovitis, joint effusion Neuro: Normal gait, no weakness Psych: Normal mood, full affect  Assessment & Plan:   Dyspnea on exertion: Suspect somewhat related to weight gain over the last several months.  Given onset with change in environment, some chest tightness with normal cardiac work-up in the past, do query underlying asthma or reactive airways disease now exacerbated in the setting of new environment.  Chest x-ray initial visit 03/2022 clear.  D-dimer negative at that time.  Pulmonary function test 09/2022 notable for mild to moderate restriction with severely low ERV and normal DLCO suggestive of extraparenchymal restriction.  Possibly falsely low in the setting of recent illness and reported pneumonia.  This was discussed with him.  Symbicort does not seem to help much.  Escalate to [employer].    Abnormal CT scan: Right middle lobe and right lower lobe groundglass opacities.  In setting of acute illness, strep throat, pneumonia per what he was told at his PCP office.  Sounds like pneumonia that is not improving with cough improving with after antibiotic therapy.  Have requested images from the [place] and hope to review in the next 24 hours but we will do a soon as possible once images are obtained.  Chronic cough: Daily, largely dry.  Denies GERD or postnasal drip symptoms.  Suspicion for asthma as above.  Escalate to [employer].  Improved  with recent antibiotics after had worsened in the setting of acute illness.  Phenotyping as above.   Return in about 6 weeks (around 11/03/2022).   Lanier Clam,  MD 09/23/2022   This appointment required 45 minutes of patient care (this includes precharting, chart review, review of results, face-to-face care, etc.).

## 2022-09-23 NOTE — Telephone Encounter (Signed)
New message sent by pt. Routing back to Dr. Silas Flood.

## 2022-09-23 NOTE — Telephone Encounter (Signed)
Dr. Silas Flood, please see mychart message sent by pt and advise about recent bloodwork.

## 2022-11-09 ENCOUNTER — Ambulatory Visit (INDEPENDENT_AMBULATORY_CARE_PROVIDER_SITE_OTHER): Payer: No Typology Code available for payment source | Admitting: Pulmonary Disease

## 2022-11-09 ENCOUNTER — Encounter: Payer: Self-pay | Admitting: Pulmonary Disease

## 2022-11-09 ENCOUNTER — Other Ambulatory Visit (INDEPENDENT_AMBULATORY_CARE_PROVIDER_SITE_OTHER): Payer: No Typology Code available for payment source

## 2022-11-09 VITALS — BP 122/82 | HR 67 | Wt 315.8 lb

## 2022-11-09 DIAGNOSIS — J4540 Moderate persistent asthma, uncomplicated: Secondary | ICD-10-CM

## 2022-11-09 DIAGNOSIS — R053 Chronic cough: Secondary | ICD-10-CM | POA: Diagnosis not present

## 2022-11-09 LAB — CBC WITH DIFFERENTIAL/PLATELET
Basophils Absolute: 0.1 10*3/uL (ref 0.0–0.1)
Basophils Relative: 1.3 % (ref 0.0–3.0)
Eosinophils Absolute: 0.1 10*3/uL (ref 0.0–0.7)
Eosinophils Relative: 2.8 % (ref 0.0–5.0)
HCT: 42.9 % (ref 39.0–52.0)
Hemoglobin: 14.2 g/dL (ref 13.0–17.0)
Lymphocytes Relative: 26.8 % (ref 12.0–46.0)
Lymphs Abs: 1.2 10*3/uL (ref 0.7–4.0)
MCHC: 33 g/dL (ref 30.0–36.0)
MCV: 81.4 fl (ref 78.0–100.0)
Monocytes Absolute: 0.3 10*3/uL (ref 0.1–1.0)
Monocytes Relative: 7.4 % (ref 3.0–12.0)
Neutro Abs: 2.8 10*3/uL (ref 1.4–7.7)
Neutrophils Relative %: 61.7 % (ref 43.0–77.0)
Platelets: 204 10*3/uL (ref 150.0–400.0)
RBC: 5.27 Mil/uL (ref 4.22–5.81)
RDW: 13.5 % (ref 11.5–15.5)
WBC: 4.6 10*3/uL (ref 4.0–10.5)

## 2022-11-09 MED ORDER — PREDNISONE 20 MG PO TABS: 40.0000 mg | ORAL_TABLET | Freq: Every day | ORAL | 0 refills | Status: AC

## 2022-11-09 MED ORDER — BREZTRI AEROSPHERE 160-9-4.8 MCG/ACT IN AERO: 2.0000 | INHALATION_SPRAY | Freq: Two times a day (BID) | RESPIRATORY_TRACT | 11 refills | Status: AC

## 2022-11-09 MED ORDER — DOXYCYCLINE HYCLATE 100 MG PO TABS: 100.0000 mg | ORAL_TABLET | Freq: Two times a day (BID) | ORAL | 0 refills | Status: DC

## 2022-11-09 NOTE — Patient Instructions (Addendum)
Nice to see you again  Please go to the following location for labs: [address] - go to the "basement" for lab draw  I refilled the [place] - please continue  I sent a prescription for prednisone and doxycycline, an antibiotic.  Return to clinic in 6 months or sooner as needed with Dr. Judeth Horn

## 2022-11-09 NOTE — Progress Notes (Signed)
Labs have normalized

## 2022-11-09 NOTE — Progress Notes (Signed)
_0  ID: Danny Peck, male    DOB: 1971-02-19, 51 y.o.   MRN: [id number]  Chief Complaint  Patient presents with   Follow-up    Follow up for chronic cough. Pt states his son had RSV and now he has a slight cough now. Pt states that he tested negative for RSV. His cough went away for the most part before his son tested pos for RSV. Pt states that the Judithann Sauger is working well with no issues. Pt wants to talk to you about prednisone and Augmentin again.     [facility]: [facility]  HPI:   51 y.o. whom are seeing in follow up for longstanding dyspnea on exertion.    Return to clinic for scheduled follow-up.  Had recent illness, possible pneumonia.  Symptoms not well controlled on ICS/LABA therapy, given concerns for possible asthma.  His PFTs were essentially normal.  We escalated his inhaler therapy to [facility].  Also check a CBC for phenotyping, IgE for phenotyping.  Judithann Sauger has helped.  Thinks is helping much better.  Thinks things are doing fine.  Would like to continue.  Reviewed his CBC which showed mild elevation in neutrophils although total WBC normal.  Likely in the setting of recent pneumonia, marginalization.  Discussed repeating CBC today to make sure things are normalized.  He complains of recent viral illness.  Ongoing tickle in throat, mild worsening in cough/shortness of breath.   HPI at initial visit: Patient ports longstanding history of dyspnea on exertion.  Multiple work-ups in the past with cardiology.  Also met with pulmonologist in the past.  Initially started with episode of high blood pressure.  Intermittent episodes of high blood pressure, dyspnea, chest tightness.  Has been worked up for pheochromocytoma negative in the past.  Now on blood pressure medicine.  Sensation of dyspnea and chest tightness has worsened over time.  Over the last year or 2.  This is associated with significant weight gain.  She discomfort described as tightness, heaviness.   He has had multiple stress test left heart cath, most recently 2018 with only 10%, minimal CAD.  He does have seasonal allergies.  Occasional bronchitis.  No seasonal changes that make things better or worse.  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  This does seem to be around the same time symptoms significantly worsened or worse in the most over the last year.  Has not used any maintenance inhalers.  Reviewed most recent chest x-ray 10/2019 that on my review interpretation reveals clear lungs bilaterally.  PMH: Hypertension, Family history:History reviewed. No pertinent family history. Surgical history: Knee surgery, shoulder arthroscopy Social history: Never smoker, lives in [place], works in [employer] / Pul[employer]:   ACT:      No data to display           MMRC:     No data to display           Epworth:      No data to display           Tests:   FENO:  No results found for: "NITRICOXIDE"  PFT:    Latest Ref Rng & Units 09/22/2022    3:01 PM  PFT Results  FVC-Pre L 3.74   FVC-Predicted Pre % 57   FVC-Post L 3.71   FVC-Predicted Post % 57   Pre FEV1/FVC % % 85   Post FEV1/FCV % % 87  FEV1-Pre L 3.19   FEV1-Predicted Pre % 63   FEV1-Post L 3.22   DLCO uncorrected ml/min/mmHg 29.88   DLCO UNC% % 80   DLCO corrected ml/min/mmHg 30.31   DLCO COR %Predicted % 81   DLVA Predicted % 126   TLC L 5.92   TLC % Predicted % 69   RV % Predicted % 69   Personally reviewed interpret as spirometry just of air trapping versus restriction.  Mild to moderate restriction present with severely low ERV.  Normal DLCO.  Suggestion of extraparenchymal restriction.  WALK:      No data to display           Imaging: Personally reviewed and as per EMR discussion this note No results found.  Lab Results: Personally reviewed CBC    Component Value Date/Time   WBC 4.6 11/09/2022 0857    RBC 5.27 11/09/2022 0857   HGB 14.2 11/09/2022 0857   HCT 42.9 11/09/2022 0857   PLT 204.0 11/09/2022 0857   MCV 81.4 11/09/2022 0857   MCH 27.1 10/13/2019 0127   MCHC 33.0 11/09/2022 0857   RDW 13.5 11/09/2022 0857   LYMPHSABS 1.2 11/09/2022 0857   MONOABS 0.3 11/09/2022 0857   EOSABS 0.1 11/09/2022 0857   BASOSABS 0.1 11/09/2022 0857    BMET    Component Value Date/Time   NA 142 10/13/2019 0127   K 3.2 (L) 10/13/2019 0127   CL 106 10/13/2019 0127   CO2 26 10/13/2019 0127   GLUCOSE 102 (H) 10/13/2019 0127   BUN 15 10/13/2019 0127   CREATININE 0.97 10/13/2019 0127   CALCIUM 9.4 10/13/2019 0127   GFRNONAA >60 10/13/2019 0127   GFRAA >60 10/13/2019 0127    BNP No results found for: "BNP"  ProBNP No results found for: "PROBNP"  Specialty Problems   None   Allergies  Allergen Reactions   Primaquine Other (See Comments)    "I'm not sure."    Immunization History  Administered Date(s) Administered   Anthrax 10/13/1999, 10/26/1999, 11/17/1999, 04/17/2008, 10/15/2008, 04/16/2009   Hepatitis A, Adult 09/20/1996, 03/13/1997   Hepatitis B, adult 02/16/2005, 03/24/2006, 05/02/2006, 02/17/2008   Influenza Split 12/30/2013, 09/12/2015, 09/11/2018   Influenza, Seasonal, Injecte, Preservative Fre 10/17/2010, 09/02/2016   Influenza,inj,Quad PF,6+ Mos 10/30/2020   Influenza-Unspecified 08/12/2010, 09/16/2011, 09/21/2012   MMR 09/20/1996, 11/26/2021, 12/24/2021   Meningococcal Polysaccharide 09/18/1996   OPV 09/20/1996   PPD Test 07/11/2006, 11/10/2021   Smallpox 05/19/2008   Td 09/18/1996, 07/11/2006   Tdap 01/12/2010   Typhoid Inactivated 05/19/2008   Typhoid Parenteral, AKD ([place] [employer]) 11/24/1996, 12/25/1996   Varicella 04/17/2008   Yellow Fever 03/18/1997    Past Medical History:  Diagnosis Date   Hypertension     Tobacco History: Social History   Tobacco Use  Smoking Status Never   Passive exposure: Never  Smokeless Tobacco Never   Counseling given:  Not Answered   Continue to not smoke  Outpatient Encounter Medications as of 11/09/2022  Medication Sig   acyclovir (ZOVIRAX) 800 MG tablet Take 0.5 tablets by mouth 2 (two) times daily.   amLODipine (NORVASC) 10 MG tablet Take 10 mg by mouth daily.   aspirin EC 81 MG tablet Take 81 mg by mouth daily.   atorvastatin (LIPITOR) 10 MG tablet Take 10 mg by mouth daily.   doxycycline (VIBRA-TABS) 100 MG tablet Take 1 tablet (100 mg total) by mouth 2 (two) times daily.   hydrochlorothiazide (HYDRODIURIL) 25 MG tablet Take 25 mg by mouth daily.  lisinopril (ZESTRIL) 5 MG tablet Take 5 mg by mouth daily.   metoprolol tartrate (LOPRESSOR) 50 MG tablet Take 50 mg by mouth 2 (two) times daily.   predniSONE (DELTASONE) 20 MG tablet Take 2 tablets (40 mg total) by mouth daily with breakfast for 5 days.   promethazine-dextromethorphan (PROMETHAZINE-DM) 6.25-15 MG/5ML syrup Take 5 mLs by mouth every 6 (six) hours as needed.   [DISCONTINUED] amoxicillin-clavulanate (AUGMENTIN) 875-125 MG tablet Take 1 tablet by mouth 2 (two) times daily.   [DISCONTINUED] Budeson-Glycopyrrol-Formoterol (BREZTRI AEROSPHERE) 160-9-4.8 MCG/ACT AERO Inhale 2 puffs into the lungs in the morning and at bedtime.   [DISCONTINUED] predniSONE (STERAPRED UNI-PAK 21 TAB) 10 MG (21) TBPK tablet Take by mouth as directed.   Budeson-Glycopyrrol-Formoterol (BREZTRI AEROSPHERE) 160-9-4.8 MCG/ACT AERO Inhale 2 puffs into the lungs in the morning and at bedtime.   [DISCONTINUED] budesonide-formoterol (SYMBICORT) 160-4.5 MCG/ACT inhaler Inhale 2 puffs into the lungs in the morning and at bedtime. (Patient not taking: Reported on 11/09/2022)   No facility-administered encounter medications on file as of 11/09/2022.     Review of Systems  Review of Systems  N/a Physical Exam  BP 122/82 (BP Location: Left Arm, Patient Position: Sitting, Cuff Size: Large)   Pulse 67   Wt (!) 315 lb 12.8 oz (143.2 kg)   SpO2 95%   BMI 37.45 kg/m   Wt  Readings from Last 5 Encounters:  11/09/22 (!) 315 lb 12.8 oz (143.2 kg)  09/22/22 (!) 310 lb (140.6 kg)  03/31/22 (!) 317 lb 12.8 oz (144.2 kg)    BMI Readings from Last 5 Encounters:  11/09/22 37.45 kg/m  09/22/22 36.76 kg/m  03/31/22 37.69 kg/m     Physical Exam General: Well-appearing, no acute distress Eyes: EOMI, no icterus Neck: Supple, no JVP Pulmonary: Clear, no wheeze Cardiovascular: Regular rate and rhythm, no murmur Abdomen: Nondistended, bowel sounds present MSK: No synovitis, joint effusion Neuro: Normal gait, no weakness Psych: Normal mood, full affect  Assessment & Plan:   Dyspnea on exertion suspect related primarily to asthma: Suspect somewhat related to weight gain over the last several months.  Given onset with change in environment, some chest tightness with normal cardiac work-up in the past, do query underlying asthma or reactive airways disease now exacerbated in the setting of new environment.  Chest x-ray initial visit 03/2022 clear.  D-dimer negative at that time.  Pulmonary function test 09/2022 notable for mild to moderate restriction with severely low ERV and normal DLCO suggestive of extraparenchymal restriction.  No real improvement on Symbicort.  Marked improvement on Breztri.  To continue.  Chronic cough: Daily, largely dry.  Denies GERD or postnasal drip symptoms.  Suspicion for asthma as above.  Escalate to [facility] with improvement.  Moderate persistent asthma with exacerbation: Dyspnea on exertion, cough.  Improved with Breztri.  Mild elevation in eosinophils of 200.  IgE not elevated.  Breztri refilled.  Course of prednisone and doxycycline sent today given worsening viral illness and symptoms.   Return in about 6 months (around 05/10/2023).   Lanier Clam, MD 11/09/2022

## 2023-03-30 ENCOUNTER — Emergency Department (HOSPITAL_COMMUNITY)
Admission: EM | Admit: 2023-03-30 | Discharge: 2023-03-31 | Disposition: A | Discharge status: Home / Self Care | Payer: No Typology Code available for payment source | Attending: Emergency Medicine | Admitting: Emergency Medicine

## 2023-03-30 ENCOUNTER — Encounter (HOSPITAL_COMMUNITY): Payer: Self-pay | Admitting: Emergency Medicine

## 2023-03-30 ENCOUNTER — Other Ambulatory Visit: Payer: Self-pay

## 2023-03-30 ENCOUNTER — Emergency Department (HOSPITAL_COMMUNITY): Payer: No Typology Code available for payment source

## 2023-03-30 DIAGNOSIS — I10 Essential (primary) hypertension: Secondary | ICD-10-CM | POA: Insufficient documentation

## 2023-03-30 DIAGNOSIS — Z79899 Other long term (current) drug therapy: Secondary | ICD-10-CM | POA: Diagnosis not present

## 2023-03-30 DIAGNOSIS — R531 Weakness: Secondary | ICD-10-CM | POA: Insufficient documentation

## 2023-03-30 DIAGNOSIS — R200 Anesthesia of skin: Secondary | ICD-10-CM | POA: Diagnosis not present

## 2023-03-30 DIAGNOSIS — R072 Precordial pain: Secondary | ICD-10-CM | POA: Insufficient documentation

## 2023-03-30 DIAGNOSIS — R079 Chest pain, unspecified: Secondary | ICD-10-CM | POA: Diagnosis present

## 2023-03-30 DIAGNOSIS — Z7982 Long term (current) use of aspirin: Secondary | ICD-10-CM | POA: Insufficient documentation

## 2023-03-30 LAB — CBC
HCT: 43 % (ref 39.0–52.0)
Hemoglobin: 14.3 g/dL (ref 13.0–17.0)
MCH: 26.9 pg (ref 26.0–34.0)
MCHC: 33.3 g/dL (ref 30.0–36.0)
MCV: 80.8 fL (ref 80.0–100.0)
Platelets: 251 10*3/uL (ref 150–400)
RBC: 5.32 MIL/uL (ref 4.22–5.81)
RDW: 12.4 % (ref 11.5–15.5)
WBC: 6.6 10*3/uL (ref 4.0–10.5)
nRBC: 0 % (ref 0.0–0.2)

## 2023-03-30 NOTE — ED Provider Triage Note (Signed)
Emergency Medicine Provider Triage Evaluation Note  Kriston Pasquarello , a 52 y.o. male  was evaluated in triage.  Pt complains of CP waxing and waning since 3 days ago. Has palpitations as well.   L sided rads to L axilla. No nausea. No SOB.  No cough currently but was coughing a week ago.  No hemoptysis.   Had some blurry vision and states he feels weaker in LUE for 3 days. States no new sx in last 24 hours.   Has had 4 heart caths done per pt and states all were clean (less than 9% blockage).    Review of Systems  Positive: CP Negative: Fever   Physical Exam  BP 132/79 (BP Location: Right Arm)   Pulse 71   Temp 98.5 F (36.9 C) (Oral)   Resp 18   Ht  (1.956 m)   Wt (!) 137 kg   SpO2 97%   BMI 35.81 kg/m  Gen:   Awake, no distress   Resp:  Normal effort  MSK:   Moves extremities without difficulty  Other:  BL grip strength symmetric, BL lower extremity w good strength. Smile symmetric. Lungs clear.   Medical Decision Making  Medically screening exam initiated at 11:15 PM.  Appropriate orders placed.  Travas Zurawski was informed that the remainder of the evaluation will be completed by another provider, this initial triage assessment does not replace that evaluation, and the importance of remaining in the ED until their evaluation is complete.  CTA chest. CP workup    Gailen Shelter, [place] 03/30/23 2320

## 2023-03-30 NOTE — ED Triage Notes (Signed)
Pt c/o left sided dull chest pain that radiates to left arm starting 3 days ago. Pt also states at approx 6:30pm tonight he started to have a headache, blurry vision, weakness and numbness in left arm.

## 2023-03-31 ENCOUNTER — Emergency Department (HOSPITAL_COMMUNITY): Payer: No Typology Code available for payment source

## 2023-03-31 LAB — BASIC METABOLIC PANEL
Anion gap: 12 (ref 5–15)
BUN: 16 mg/dL (ref 6–20)
CO2: 27 mmol/L (ref 22–32)
Calcium: 9.5 mg/dL (ref 8.9–10.3)
Chloride: 102 mmol/L (ref 98–111)
Creatinine, Ser: 1.22 mg/dL (ref 0.61–1.24)
GFR, Estimated: >60 mL/min (ref >60)
Glucose, Bld: 105 mg/dL — ABNORMAL HIGH (ref 70–99)
Potassium: 3.4 mmol/L — ABNORMAL LOW (ref 3.5–5.1)
Sodium: 141 mmol/L (ref 135–145)

## 2023-03-31 LAB — TROPONIN I (HIGH SENSITIVITY)
Troponin I (High Sensitivity): 4 ng/L (ref <18)
Troponin I (High Sensitivity): 4 ng/L (ref <18)

## 2023-03-31 LAB — PROTIME-INR
INR: 1 (ref 0.8–1.2)
Prothrombin Time: 13.5 seconds (ref 11.4–15.2)

## 2023-03-31 MED ORDER — IOHEXOL 350 MG/ML SOLN
100.0000 mL | Freq: Once | INTRAVENOUS | Status: AC | PRN
  Administered 2023-03-31: 100 mL via INTRAVENOUS

## 2023-03-31 MED ORDER — HYDROCODONE-ACETAMINOPHEN 5-325 MG PO TABS
1.0000 | ORAL_TABLET | Freq: Once | ORAL | Status: AC
  Administered 2023-03-31: 1 via ORAL
  Filled 2023-03-31: qty 1

## 2023-03-31 NOTE — Discharge Instructions (Signed)

## 2023-03-31 NOTE — ED Notes (Signed)
Pt being taken to CT via CT tech.

## 2023-03-31 NOTE — ED Provider Notes (Signed)
[place] AT [facility] Provider Note   CSN: [id number] Arrival date & time: 03/30/23  2247     History  Chief Complaint  Patient presents with   Chest Pain    Danny Peck is a 52 y.o. male.  The history is provided by the patient.  Patient w/ history of HTN  presents with chest pain.  He reports over the past 3 days he has had sharp pain in his left upper chest at times it goes in his neck.  At times he also has numbness and weakness in the left arm only.  No fevers or vomiting.  No diaphoresis.  No shortness of breath. He reports he has had these episodes of chest pain on and off for quite some time.  He reports left arm weakness appears new He reports he has had previous heart catheterizations at outside facilities does not reveal any significant blockage No previous history of CVA, no history of VTE He also reports earlier tonight he started having headache and blurry vision which is typical of when he has elevated blood pressure. He only has the left arm symptoms when he has chest pain    Past Medical History:  Diagnosis Date   Hypertension     Home Medications Prior to Admission medications   Medication Sig Start Date End Date Taking? Authorizing Provider  acyclovir (ZOVIRAX) 800 MG tablet Take 0.5 tablets by mouth 2 (two) times daily. 01/19/21   [provider]  amLODipine (NORVASC) 10 MG tablet Take 10 mg by mouth daily.    [provider]  aspirin EC 81 MG tablet Take 81 mg by mouth daily.    [provider]  atorvastatin (LIPITOR) 10 MG tablet Take 10 mg by mouth daily.    [provider]  Budeson-Glycopyrrol-Formoterol (BREZTRI AEROSPHERE) 160-9-4.8 MCG/ACT AERO Inhale 2 puffs into the lungs in the morning and at bedtime. 11/09/22   Hunsucker, Lesia Sago, MD  hydrochlorothiazide (HYDRODIURIL) 25 MG tablet Take 25 mg by mouth daily.    [provider]  lisinopril (ZESTRIL) 5 MG tablet Take  5 mg by mouth daily.    [provider]  metoprolol tartrate (LOPRESSOR) 50 MG tablet Take 50 mg by mouth 2 (two) times daily.    [provider]      Allergies    Primaquine    Review of Systems   Review of Systems  Constitutional:  Negative for fever.  Cardiovascular:  Positive for chest pain.    Physical Exam Updated Vital Signs BP (!) 136/90   Pulse 73   Temp 98 F (36.7 C) (Oral)   Resp 19   Ht 1.956 m (6\' 5" )   Wt (!) 137 kg   SpO2 97%   BMI 35.81 kg/m  Physical Exam CONSTITUTIONAL: Well developed/well nourished HEAD: Normocephalic/atraumatic EYES: EOMI/PERRL, no nystagmus ENMT: Mucous membranes moist NECK: supple no meningeal signs Mild cervical paraspinal tenderness, no erythema/bruising CV: S1/S2 noted, no murmurs/rubs/gallops noted LUNGS: Lungs are clear to auscultation bilaterally, no apparent distress ABDOMEN: soft, nontender NEURO:Awake/alert, face symmetric, no arm or leg drift is noted Equal 5/5 strength with shoulder abduction, elbow flex/extension, wrist flex/extension in upper extremities and equal hand grips bilaterally Equal 5/5 strength with hip flexion,knee flex/extension, foot dorsi/plantar flexion Cranial nerves 3/4/5/6/06/19/09/11/12 tested and intact Sensation to light touch intact in all extremities EXTREMITIES: pulses normalx4, full ROM SKIN: warm, color normal PSYCH: no abnormalities of mood noted  ED Results / Procedures / Treatments  Labs (all labs ordered are listed, but only abnormal results are displayed) Labs Reviewed  BASIC METABOLIC PANEL - Abnormal; Notable for the following components:      Result Value   Potassium 3.4 (*)    Glucose, Bld 105 (*)    All other components within normal limits  CBC  PROTIME-INR  TROPONIN I (HIGH SENSITIVITY)  TROPONIN I (HIGH SENSITIVITY)    EKG EKG Interpretation  Date/Time:  Thursday March 30 2023 22:56:44 EDT Ventricular Rate:  71 PR Interval:  202 QRS  Duration: 100 QT Interval:  402 QTC Calculation: 436 R Axis:   -77 Text Interpretation: Sinus rhythm with occasional Premature ventricular complexes Left axis deviation Incomplete right bundle branch block Possible Inferior infarct , age undetermined Abnormal ECG Confirmed by Zadie Rhine ([id number]) on 03/30/2023 11:51:15 PM  Radiology CT Angio Chest/Abd/Pel for Dissection W and/or W/WO  Result Date: 03/31/2023 CLINICAL DATA:  Left-sided chest pain radiating into the left arm for 3 days. Tonight additional symptoms of headache, blurry vision, weakness, numbness in left arm EXAM: CT ANGIOGRAPHY CHEST, ABDOMEN AND PELVIS TECHNIQUE: Non-contrast CT of the chest was initially obtained. Multidetector CT imaging through the chest, abdomen and pelvis was performed using the standard protocol during bolus administration of intravenous contrast. Multiplanar reconstructed images and MIPs were obtained and reviewed to evaluate the vascular anatomy. RADIATION DOSE REDUCTION: This exam was performed according to the departmental dose-optimization program which includes automated exposure control, adjustment of the mA and/or kV according to patient size and/or use of iterative reconstruction technique. CONTRAST:  OMNIPAQUE IOHEXOL 350 MG/ML SOLN COMPARISON:  CT chest 09/01/2022 FINDINGS: CTA CHEST FINDINGS Cardiovascular: Normal heart size. No pericardial effusion. Preferential opacification of the thoracic aorta without aneurysm, dissection, penetrating atherosclerotic ulcer, or intramural hematoma. No central pulmonary embolism. Mediastinum/Nodes: Unremarkable esophagus.  No thoracic adenopathy. Lungs/Pleura: No focal consolidation, pleural effusion, or pneumothorax. Central airways are patent. Musculoskeletal: No acute fracture. Review of the MIP images confirms the above findings. CTA ABDOMEN AND PELVIS FINDINGS VASCULAR Aorta: Mild aortic atherosclerotic plaque. No aneurysm, dissection, or stenosis. Celiac:  Widely patent without aneurysm are dissection. SMA: Widely patent without aneurysm or dissection. Renals: Widely patent. IMA: Patent. Inflow: Scattered calcified atherosclerotic plaque. No aneurysm, dissection, or significant stenosis. Veins: No obvious venous abnormality within the limitations of this arterial phase study. Review of the MIP images confirms the above findings. NON-VASCULAR Hepatobiliary: No focal liver abnormality is seen. No gallstones, gallbladder wall thickening, or biliary dilatation. Pancreas: Unremarkable. Spleen: Unremarkable. Adrenals/Urinary Tract: Normal adrenal glands. No urinary calculi or hydronephrosis. Normal bladder. Stomach/Bowel: Normal caliber large and small bowel. No bowel wall thickening. Normal appendix. Stomach is within normal limits. Lymphatic: No enlarged abdominal or pelvic lymph nodes. Reproductive: Unremarkable. Other: No free intraperitoneal fluid or air. Musculoskeletal: No acute fracture. Review of the MIP images confirms the above findings. IMPRESSION: 1. No acute aortic syndrome. 2. No acute abnormality in the chest, abdomen, or pelvis. Aortic Atherosclerosis (ICD10-I70.0). Electronically Signed   By: Minerva Fester M.D.   On: 03/31/2023 01:43   CT HEAD WO CONTRAST ( )  Result Date: 03/31/2023 CLINICAL DATA:  Neuro deficit, acute, stroke suspected EXAM: CT HEAD WITHOUT CONTRAST TECHNIQUE: Contiguous axial images were obtained from the base of the skull through the vertex without intravenous contrast. RADIATION DOSE REDUCTION: This exam was performed according to the departmental dose-optimization program which includes automated exposure control, adjustment of the mA and/or kV according to patient size and/or use of iterative reconstruction technique. COMPARISON:  None Available. FINDINGS: Brain: No evidence of large-territorial acute infarction. No parenchymal hemorrhage. No mass lesion. No extra-axial collection. No mass effect or midline shift. No  hydrocephalus. Basilar cisterns are patent. Vascular: No hyperdense vessel. Skull: No acute fracture or focal lesion. Sinuses/Orbits: Paranasal sinuses and mastoid air cells are clear. The orbits are unremarkable. Other: None. IMPRESSION: No acute intracranial abnormality. Electronically Signed   By: Tish Frederickson M.D.   On: 03/31/2023 01:40   DG Chest 2 View  Result Date: 03/30/2023 CLINICAL DATA:  Left-sided chest pain EXAM: CHEST - 2 VIEW COMPARISON:  Radiographs 03/31/2022 FINDINGS: Stable cardiomediastinal silhouette. Aortic atherosclerotic calcification. IMPRESSION: No active cardiopulmonary disease. Electronically Signed   By: Minerva Fester M.D.   On: 03/30/2023 23:45    Procedures Procedures    Medications Ordered in ED Medications  HYDROcodone-acetaminophen (NORCO/VICODIN) 5-325 MG per tablet 1 tablet (1 tablet Oral Given 03/31/23 0112)  iohexol (OMNIPAQUE) 350 MG/ML injection 100 mL (100 mLs Intravenous Contrast Given 03/31/23 0131)    ED Course/ Medical Decision Making/ A&P Clinical Course as of 03/31/23 0420  Fri Mar 31, 2023  0206 Patient feeling improved, workup pending [DW]  0419 Patient resting comfortably, no acute distress.  Extensive workup was unremarkable.  At this point I feel patient is safe for outpatient management.  His vital signs are appropriate.  He has been referred to our local cardiologist. [DW]    Clinical Course User Index [DW] Zadie Rhine, MD                             Medical Decision Making Risk Prescription drug management.   This patient presents to the ED for concern of chest pain, this involves an extensive number of treatment options, and is a complaint that carries with it a high risk of complications and morbidity.  The differential diagnosis includes but is not limited to acute coronary syndrome, aortic dissection, pulmonary embolism, pericarditis, pneumothorax, pneumonia, myocarditis, pleurisy, esophageal rupture     Comorbidities that complicate the patient evaluation: Patient's presentation is complicated by their history of hypertension  Social Determinants of Health: Patient's  death of multiple family members   increases the complexity of managing their presentation  Additional history obtained: Records reviewed Care Everywhere/External Records  Lab Tests: I Ordered, and personally interpreted labs.  The pertinent results include: Labs overall unremarkable  Imaging Studies ordered: I ordered imaging studies including CT scan dissection study and head and X-ray chest   I independently visualized and interpreted imaging which showed no acute findings I agree with the radiologist interpretation  Cardiac Monitoring: The patient was maintained on a cardiac monitor.  I personally viewed and interpreted the cardiac monitor which showed an underlying rhythm of:  sinus rhythm  Medicines ordered and prescription drug management: I ordered medication including Vicodin for pain Reevaluation of the patient after these medicines showed that the patient    improved  Test Considered: I considered admission, but patient had the symptoms frequently, no acute findings on today's evaluation, safe for discharge  Reevaluation: After the interventions noted above, I reevaluated the patient and found that they have :improved  Complexity of problems addressed: Patient's presentation is most consistent with  acute presentation with potential threat to life or bodily function  Disposition: After consideration of the diagnostic results and the patient's response to treatment,  I feel that the patent would benefit from discharge   .     Patient well-appearing.  I have low suspicion for CVA as cause of the left arm numbness as it is only related to his chest pain.  He also reports neck pain, this could be radiculopathy. No indication for admission for CVA/TIA        Final Clinical Impression(s) / ED  Diagnoses Final diagnoses:  Precordial pain    Rx / DC Orders ED Discharge Orders          Ordered    Ambulatory referral to Cardiology        03/31/23 0208              Zadie Rhine, MD 03/31/23 0422

## 2023-05-12 ENCOUNTER — Ambulatory Visit: Payer: No Typology Code available for payment source | Admitting: Cardiology

## 2023-06-20 ENCOUNTER — Telehealth: Payer: Self-pay | Admitting: Pulmonary Disease

## 2023-06-20 NOTE — Telephone Encounter (Signed)
FYI, [facility] denied authorization for our office, patient is going through the [place] [facility], seen on 06/14/23 with f/u scheduled for 07/04/23.

## 2023-08-27 ENCOUNTER — Emergency Department (HOSPITAL_BASED_OUTPATIENT_CLINIC_OR_DEPARTMENT_OTHER): Payer: No Typology Code available for payment source | Admitting: Radiology

## 2023-08-27 ENCOUNTER — Other Ambulatory Visit: Payer: Self-pay

## 2023-08-27 ENCOUNTER — Emergency Department (HOSPITAL_BASED_OUTPATIENT_CLINIC_OR_DEPARTMENT_OTHER): Payer: No Typology Code available for payment source

## 2023-08-27 ENCOUNTER — Emergency Department (HOSPITAL_BASED_OUTPATIENT_CLINIC_OR_DEPARTMENT_OTHER)
Admission: EM | Admit: 2023-08-27 | Discharge: 2023-08-27 | Disposition: A | Discharge status: Home / Self Care | Payer: No Typology Code available for payment source | Attending: Emergency Medicine | Admitting: Emergency Medicine

## 2023-08-27 DIAGNOSIS — R0602 Shortness of breath: Secondary | ICD-10-CM | POA: Diagnosis not present

## 2023-08-27 DIAGNOSIS — R531 Weakness: Secondary | ICD-10-CM | POA: Insufficient documentation

## 2023-08-27 DIAGNOSIS — R079 Chest pain, unspecified: Secondary | ICD-10-CM

## 2023-08-27 DIAGNOSIS — Z79899 Other long term (current) drug therapy: Secondary | ICD-10-CM | POA: Diagnosis not present

## 2023-08-27 DIAGNOSIS — Z7982 Long term (current) use of aspirin: Secondary | ICD-10-CM | POA: Insufficient documentation

## 2023-08-27 DIAGNOSIS — I10 Essential (primary) hypertension: Secondary | ICD-10-CM | POA: Insufficient documentation

## 2023-08-27 DIAGNOSIS — R0789 Other chest pain: Secondary | ICD-10-CM | POA: Insufficient documentation

## 2023-08-27 LAB — CBC
HCT: 43.2 % (ref 39.0–52.0)
Hemoglobin: 14.1 g/dL (ref 13.0–17.0)
MCH: 26.3 pg (ref 26.0–34.0)
MCHC: 32.6 g/dL (ref 30.0–36.0)
MCV: 80.4 fL (ref 80.0–100.0)
Platelets: 217 10*3/uL (ref 150–400)
RBC: 5.37 MIL/uL (ref 4.22–5.81)
RDW: 13 % (ref 11.5–15.5)
WBC: 5.1 10*3/uL (ref 4.0–10.5)
nRBC: 0 % (ref 0.0–0.2)

## 2023-08-27 LAB — BASIC METABOLIC PANEL
Anion gap: 8 (ref 5–15)
BUN: 17 mg/dL (ref 6–20)
CO2: 28 mmol/L (ref 22–32)
Calcium: 8.9 mg/dL (ref 8.9–10.3)
Chloride: 107 mmol/L (ref 98–111)
Creatinine, Ser: 1.02 mg/dL (ref 0.61–1.24)
GFR, Estimated: >60 mL/min (ref >60)
Glucose, Bld: 98 mg/dL (ref 70–99)
Potassium: 3.5 mmol/L (ref 3.5–5.1)
Sodium: 143 mmol/L (ref 135–145)

## 2023-08-27 LAB — TSH: TSH: 1.134 u[IU]/mL (ref 0.350–4.500)

## 2023-08-27 LAB — D-DIMER, QUANTITATIVE: D-Dimer, Quant: <0.27 ug{FEU}/mL (ref 0.00–0.50)

## 2023-08-27 LAB — TROPONIN I (HIGH SENSITIVITY)
Troponin I (High Sensitivity): 7 ng/L (ref <18)
Troponin I (High Sensitivity): 8 ng/L (ref <18)

## 2023-08-27 MED ORDER — KETOROLAC TROMETHAMINE 15 MG/ML IJ SOLN
15.0000 mg | Freq: Once | INTRAMUSCULAR | Status: AC
  Administered 2023-08-27: 15 mg via INTRAVENOUS
  Filled 2023-08-27: qty 1

## 2023-08-27 MED ORDER — NITROGLYCERIN 0.4 MG SL SUBL: 0.4000 mg | SUBLINGUAL_TABLET | SUBLINGUAL | 0 refills | Status: DC | PRN

## 2023-08-27 MED ORDER — NITROGLYCERIN 0.4 MG SL SUBL: 0.4000 mg | SUBLINGUAL_TABLET | Freq: Once | SUBLINGUAL | Status: DC

## 2023-08-27 MED ORDER — ASPIRIN 81 MG PO CHEW: 324.0000 mg | CHEWABLE_TABLET | Freq: Once | ORAL | Status: DC

## 2023-08-27 MED ORDER — ALUM & MAG HYDROXIDE-SIMETH 200-200-20 MG/5ML PO SUSP
15.0000 mL | Freq: Once | ORAL | Status: AC
  Administered 2023-08-27: 15 mL via ORAL
  Filled 2023-08-27: qty 30

## 2023-08-27 MED ORDER — NITROGLYCERIN 0.4 MG SL SUBL: 0.4000 mg | SUBLINGUAL_TABLET | SUBLINGUAL | 0 refills | Status: AC | PRN

## 2023-08-27 MED ORDER — LACTATED RINGERS IV SOLN: INTRAVENOUS | Status: DC

## 2023-08-27 MED ORDER — FAMOTIDINE 20 MG PO TABS
20.0000 mg | ORAL_TABLET | Freq: Once | ORAL | Status: AC
  Administered 2023-08-27: 20 mg via ORAL
  Filled 2023-08-27: qty 1

## 2023-08-27 NOTE — Discharge Instructions (Signed)
Please follow-up with your cardiologist in regards to recent symptoms and ER visit.  Today your labs and imaging were reassuring however you will need to follow-up with your cardiologist for long-term management.  Please monitor your symptoms and if symptoms change or worsen please return to ER.

## 2023-08-27 NOTE — ED Triage Notes (Signed)
Pt POV from home reporting L side chest pain radiating to L shoulder for past 4 days that has gotten worse today. Hx HTN, 190 sys today, compliant with BP meds. Ambulatory to triage, NAD noted at this time

## 2023-08-27 NOTE — ED Provider Notes (Signed)
Patient given in sign out by Sherian Maroon, PA-C.  Please review their note for patient HPI, physical exam, workup.  At this time the plan is discharge pending delta troponin.  No troponin was negative and so patient was discharged accordingly as his workup has been reassuring and patient has had reassuring CTA dissection studies and cath done in the past.  At time of discharge patient requested to speak with me and stated he was still having chest discomfort with palpitations.  Patient's vitals were stable and patient was unable to further describe the symptoms.  Patient does see cardiology through the [place] and had a suggest that he follows up with them.  Patient states he was still concerned about the palpitations and has never had a thyroid study done and so I ordered a TSH as patient is never had this before.  Patient stated that he wanted to leave before the results and at this time is a reasonable request.  I encouraged patient to follow-up with his cardiologist and patient was given strict return precautions encouraged return to ER symptoms are change or worsen.  Patient stable for discharge.    Netta Corrigan, PA-C 08/27/23 2050    Gwyneth Sprout, MD 08/31/23 [phone]

## 2023-08-27 NOTE — ED Provider Notes (Addendum)
[place] EMERGENCY DEPARTMENT AT [facility] Provider Note   CSN: [id number] Arrival date & time: 08/27/23  1653     History  No chief complaint on file.   Danny Peck is a 52 y.o. male.  HPI   52 year old male presents emergency department with complaints of left chest pain with radiation down left arm as well as left neck.  Patient states that he has been having symptoms constantly for the past 4 days and symptoms have been constant since onset.  Reports pain sometimes going away when he goes to sleep but seems to reappear soon as he wakes up.  Reports pain worsened with certain movements.  Reports mild feelings of shortness of breath over the same amount of time.  Also complaining of some weakness of his left hand.  States that the symptoms have also been present for the past 4 days and constant.  She states that he has had similar symptoms prompting visit to the emergency department before with most recently being this past April with near exact presentation.  States he has been taking his blood pressure over the past 4 days and has been elevated in the 190s systolic despite being compliant with his at home medications.  Also reports frontal headache.  Denies any visual disturbance, gait abnormality, slurred speech, facial droop, weakness/sensory deficits otherwise in upper or lower extremities.  Denies any fever, chills, night sweats, abdominal pain, nausea, vomiting, diaphoresis, change in bowel habits.  Denies any fever, cough, congestion, history of DVT/PE, recent surgery/immobilization, known malignancy, known coagulopathy.  Patient states that he has had very extensive cardiac workup with 4 heart caths with most recent 2 years ago which only showed 3% blockage.  Gets most of his care through the [place] so not able to be visualized.  Patient states within the past year has had coronary CT study as well as echocardiogram both of which he reports being "normal."  Patient states  that he is somewhat frustrated whenever he has the symptoms because "no one can find anything wrong."  Past medical history significant for hypertension, hyperlipidemia,  Home Medications Prior to Admission medications   Medication Sig Start Date End Date Taking? Authorizing Provider  acyclovir (ZOVIRAX) 800 MG tablet Take 0.5 tablets by mouth 2 (two) times daily. 01/19/21   [provider]  amLODipine (NORVASC) 10 MG tablet Take 10 mg by mouth daily.    [provider]  aspirin EC 81 MG tablet Take 81 mg by mouth daily.    [provider]  atorvastatin (LIPITOR) 10 MG tablet Take 10 mg by mouth daily.    [provider]  Budeson-Glycopyrrol-Formoterol (BREZTRI AEROSPHERE) 160-9-4.8 MCG/ACT AERO Inhale 2 puffs into the lungs in the morning and at bedtime. 11/09/22   Hunsucker, Lesia Sago, MD  hydrochlorothiazide (HYDRODIURIL) 25 MG tablet Take 25 mg by mouth daily.    [provider]  lisinopril (ZESTRIL) 5 MG tablet Take 5 mg by mouth daily.    [provider]  metoprolol tartrate (LOPRESSOR) 50 MG tablet Take 50 mg by mouth 2 (two) times daily.    [provider]      Allergies    Primaquine    Review of Systems   Review of Systems  All other systems reviewed and are negative.   Physical Exam Updated Vital Signs There were no vitals taken for this visit. Physical Exam Vitals and nursing note reviewed.  Constitutional:      General: He is not in acute  distress.    Appearance: He is well-developed.  HENT:     Head: Normocephalic and atraumatic.  Eyes:     Conjunctiva/sclera: Conjunctivae normal.  Cardiovascular:     Rate and Rhythm: Normal rate and regular rhythm.     Pulses: Normal pulses.     Heart sounds: No murmur heard. Pulmonary:     Effort: Pulmonary effort is normal. No respiratory distress.     Breath sounds: Normal breath sounds. No wheezing, rhonchi or rales.  Abdominal:     Palpations: Abdomen is  soft.     Tenderness: There is no abdominal tenderness.  Musculoskeletal:        General: No swelling.     Cervical back: Neck supple.     Comments: Some tenderness along left trapezial ridge as well as left paraspinal muscles in cervical region.  Full range of motion of bilateral shoulders, elbows, wrist, digits.  Radial pulses 2+ bilaterally.  Skin:    General: Skin is warm and dry.     Capillary Refill: Capillary refill takes less than 2 seconds.  Neurological:     Mental Status: He is alert.     Comments: Alert and oriented to self, place, time and event.   Speech is fluent, clear without dysarthria or dysphasia.   Strength 5/5 in upper/lower extremities.  No obvious weakness when comparing left and right upper extremities. Sensation intact in upper/lower extremities   Normal gait.  CN I not tested  CN II not tested CN III, IV, VI PERRLA and EOMs intact bilaterally  CN V Intact sensation to sharp and light touch to the face  CN VII facial movements symmetric  CN VIII not tested  CN IX, X no uvula deviation, symmetric rise of soft palate  CN XI 5/5 SCM and trapezius strength bilaterally  CN XII Midline tongue protrusion, symmetric L/R movements   Psychiatric:        Mood and Affect: Mood normal.     ED Results / Procedures / Treatments   Labs (all labs ordered are listed, but only abnormal results are displayed) Labs Reviewed - No data to display  EKG None  Radiology No results found.  Procedures Procedures    Medications Ordered in ED Medications - No data to display  ED Course/ Medical Decision Making/ A&P                                 Medical Decision Making Amount and/or Complexity of Data Reviewed Labs: ordered. Radiology: ordered.  Risk OTC drugs. Prescription drug management.   This patient presents to the ED for concern of chest pain, this involves an extensive number of treatment options, and is a complaint that carries with it a high  risk of complications and morbidity.  The differential diagnosis includes ACS, PE, pneumothorax, aortic dissection, GERD, peritonitis/myocarditis/tamponade, CHF,   Co morbidities that complicate the patient evaluation  See HPI   Additional history obtained:  Additional history obtained from EMR External records from outside source obtained and reviewed including hospital records.  Patient with aortic dissection study on 03/31/2023 which was negative for any acute abnormality.   Lab Tests:  I Ordered, and personally interpreted labs.  The pertinent results include: D-dimer negative.  No leukocytosis.  No evidence of anemia.  Platelets within range.  No electrolyte abnormalities.  No renal dysfunction.  Troponin of 7 with repeat pending   Imaging Studies ordered:  I  ordered imaging studies including CT head, chest x-ray I independently visualized and interpreted imaging which showed  Chest x-ray: No acute cardiopulmonary abnormalities CT head: No acute abnormality. I agree with the radiologist interpretation  Cardiac Monitoring: / EKG:  The patient was maintained on a cardiac monitor.  I personally viewed and interpreted the cardiac monitored which showed an underlying rhythm of: Sinus rhythm with prolonged PR.  Incomplete right bundle branch block.  No acute change from prior EKG performed.  Consultations Obtained:  I requested consultation with attending physician or Dr. Anitra Lauth who  agreement treatment plan going forward   Problem List / ED Course / Critical interventions / Medication management  Chest pain I ordered medication including Toradol, Maalox, Pepcid   Reevaluation of the patient after these medicines showed that the patient improved I have reviewed the patients home medicines and have made adjustments as needed   Social Determinants of Health:  Denies tobacco, licit drug use   Test / Admission - Considered:  Chest pain Vitals signs significant for  hypertension with blood pressure 156/91. Otherwise within normal range and stable throughout visit. Laboratory/imaging studies significant for: See above 52 year old male presents emergency department with complaints of left-sided chest pain with radiation to left neck as well as down left arm.  Patient with similar presentations in the past with rather thorough workups which has been negative for any acute abnormality.  Patient has had 4 heart caths with most recent being 2 years ago with only showing 3% stenosis per patient.  Also per patient, patient had coronary CT calcium score as well as echocardiogram both of which were "normal."  Patient gets most of his care through the [place] so unable to view most of testing.  Most recently, patient had been seen in April for near identical presentation with delta negative troponin, negative CT aortic dissection study.  Patient currently without pulse deficits, significant hypertension, neurologic deficit.  With negative prior CTA dissection study, low suspicion for dissection.  Patient without acute ischemic change on EKG with initial negative troponin; pending second troponin for ACS rule out.  Patient with negative D-dimer; low suspicion for PE.  Patient did note some improvement with GI cocktail as well as NSAID administered while in the emergency department.  Patient also complaining of headache with reports of systolic blood pressure in the outpatient setting in the 190s.  Patient with nonfocal neurologic exam but given headache and reported significant hypertension in the outpatient setting, CT imaging was ordered which was negative for any acute intracranial abnormality.  Patient did note improvement in headache with administration of NSAID.  Awaiting second troponin for ACS rule out will expect discharge with follow-up with cardiology in the outpatient setting for reevaluation if negative.  At shift change, patient care handed off to [facility].  Patient stable  upon shift change.    Final Clinical Impression(s) / ED Diagnoses Final diagnoses:  None    Rx / DC Orders ED Discharge Orders     None           Peter Garter, [place] 08/27/23 Kermit Balo, MD 08/31/23 [phone]

## 2023-08-27 NOTE — ED Notes (Signed)
Patient transported to X-ray 

## 2025-01-16 ENCOUNTER — Emergency Department (HOSPITAL_COMMUNITY): Admit: 2025-01-16 | Discharge: 2025-01-16 | Disposition: A

## 2025-01-16 ENCOUNTER — Encounter (HOSPITAL_COMMUNITY): Payer: Self-pay

## 2025-01-16 ENCOUNTER — Other Ambulatory Visit: Payer: Self-pay

## 2025-01-16 ENCOUNTER — Emergency Department (HOSPITAL_COMMUNITY)
Admission: EM | Admit: 2025-01-16 | Discharge: 2025-01-16 | Discharge status: Against Medical Advice | Source: Ambulatory Visit | Attending: Emergency Medicine | Admitting: Emergency Medicine

## 2025-01-16 DIAGNOSIS — R202 Paresthesia of skin: Secondary | ICD-10-CM | POA: Insufficient documentation

## 2025-01-16 DIAGNOSIS — R519 Headache, unspecified: Secondary | ICD-10-CM | POA: Insufficient documentation

## 2025-01-16 DIAGNOSIS — Z5321 Procedure and treatment not carried out due to patient leaving prior to being seen by health care provider: Secondary | ICD-10-CM | POA: Insufficient documentation

## 2025-01-16 DIAGNOSIS — M79601 Pain in right arm: Secondary | ICD-10-CM | POA: Insufficient documentation

## 2025-01-16 LAB — CBC WITH DIFFERENTIAL/PLATELET
Abs Immature Granulocytes: 0.01 10*3/uL (ref 0.00–0.07)
Basophils Absolute: 0 10*3/uL (ref 0.0–0.1)
Basophils Relative: 1 %
Eosinophils Absolute: 0 10*3/uL (ref 0.0–0.5)
Eosinophils Relative: 1 %
HCT: 42.5 % (ref 39.0–52.0)
Hemoglobin: 14.1 g/dL (ref 13.0–17.0)
Immature Granulocytes: 0 %
Lymphocytes Relative: 41 %
Lymphs Abs: 1.6 10*3/uL (ref 0.7–4.0)
MCH: 27.2 pg (ref 26.0–34.0)
MCHC: 33.2 g/dL (ref 30.0–36.0)
MCV: 82 fL (ref 80.0–100.0)
Monocytes Absolute: 0.3 10*3/uL (ref 0.1–1.0)
Monocytes Relative: 7 %
Neutro Abs: 1.9 10*3/uL (ref 1.7–7.7)
Neutrophils Relative %: 50 %
Platelets: 210 10*3/uL (ref 150–400)
RBC: 5.18 MIL/uL (ref 4.22–5.81)
RDW: 12.2 % (ref 11.5–15.5)
WBC: 3.9 10*3/uL — ABNORMAL LOW (ref 4.0–10.5)
nRBC: 0 % (ref 0.0–0.2)

## 2025-01-16 LAB — BASIC METABOLIC PANEL WITH GFR
Anion gap: 11 (ref 5–15)
BUN: 12 mg/dL (ref 6–20)
CO2: 23 mmol/L (ref 22–32)
Calcium: 8.8 mg/dL — ABNORMAL LOW (ref 8.9–10.3)
Chloride: 106 mmol/L (ref 98–111)
Creatinine, Ser: 0.92 mg/dL (ref 0.61–1.24)
GFR, Estimated: >60 mL/min
Glucose, Bld: 84 mg/dL (ref 70–99)
Potassium: 3.6 mmol/L (ref 3.5–5.1)
Sodium: 140 mmol/L (ref 135–145)

## 2025-01-16 NOTE — ED Triage Notes (Signed)
 Denies [facility] but having intermittent chest pain.

## 2025-01-16 NOTE — ED Provider Triage Note (Signed)
 Emergency Medicine Provider Triage Evaluation Note  Jonnathan Birman , a 54 y.o. male  was evaluated in triage.  Pt complains of tingling in the right arm, headaches. Went to PCP at the [facility] today and sent to the ER for concern for stroke. Head pain described as sharp, dull at times. Onset 10 days ago, no recent injuries, sickness, travel, no hormone therapy, non smoker.   Review of Systems  Positive: Headaches, right arm tingling, pain in right axillary area  Negative: Changes in speech, gait, vision, CP, SHOB  Physical Exam  BP (!) 145/95   Pulse 84   Temp 98.2 F (36.8 C) (Oral)   Resp 16   Ht 6' 5 (1.956 m)   Wt 129.3 kg   SpO2 97%   BMI 33.80 kg/m  Gen:   Awake, no distress   Resp:  Normal effort  MSK:   Moves extremities without difficulty  Other:  Strong radial pulse, reports altered sensation to right hand/arm   Medical Decision Making  Medically screening exam initiated at 1:53 PM.  Appropriate orders placed.  Orhan Mix was informed that the remainder of the evaluation will be completed by another provider, this initial triage assessment does not replace that evaluation, and the importance of remaining in the ED until their evaluation is complete.     Beverley Leita LABOR, PA-C 01/16/25 1358

## 2025-01-16 NOTE — Progress Notes (Signed)
 Right upper extremity venous duplex has been completed. Preliminary results can be found in CV Proc through chart review.  Results were given to Leita Chancy PA.  01/16/25 2:53 PM Cathlyn Collet RVT

## 2025-01-16 NOTE — ED Triage Notes (Signed)
 Here by POV from home for R axilla pain and tenderness. Onset 1.5 weeks ago. Radiates down R arm. Concern for clot. Sent by [place]. Pt alert, NAD, calm, interactive, resps e/u, speaking in clear complete sentences. Steady gait.

## 2025-01-16 NOTE — ED Notes (Signed)
 Pt stated he needs to leave to go pick up his kids. Pt is aware that he will need to check back in if he comes back
